# Patient Record
Sex: Male | Born: 1965
Health system: Southern US, Community
[De-identification: ages and names within clinical notes are randomized; demographics above are authoritative.]

## PROBLEM LIST (undated history)

## (undated) DIAGNOSIS — J439 Emphysema, unspecified: Secondary | ICD-10-CM

## (undated) DIAGNOSIS — Z972 Presence of dental prosthetic device (complete) (partial): Secondary | ICD-10-CM

## (undated) DIAGNOSIS — I2510 Atherosclerotic heart disease of native coronary artery without angina pectoris: Secondary | ICD-10-CM

## (undated) DIAGNOSIS — K219 Gastro-esophageal reflux disease without esophagitis: Secondary | ICD-10-CM

## (undated) DIAGNOSIS — I10 Essential (primary) hypertension: Secondary | ICD-10-CM

## (undated) DIAGNOSIS — I700 Atherosclerosis of aorta: Secondary | ICD-10-CM

## (undated) DIAGNOSIS — R519 Headache, unspecified: Secondary | ICD-10-CM

## (undated) HISTORY — DX: Atherosclerosis of aorta: I70.0

## (undated) HISTORY — DX: Essential (primary) hypertension: I10

## (undated) HISTORY — DX: Atherosclerotic heart disease of native coronary artery without angina pectoris: I25.10

## (undated) HISTORY — DX: Emphysema, unspecified: J43.9

---

## 2004-06-02 ENCOUNTER — Emergency Department: Payer: Self-pay | Admitting: Emergency Medicine

## 2004-06-17 ENCOUNTER — Emergency Department: Payer: Self-pay | Admitting: Emergency Medicine

## 2008-01-14 ENCOUNTER — Emergency Department: Payer: Self-pay | Admitting: Emergency Medicine

## 2008-04-04 ENCOUNTER — Emergency Department: Payer: Self-pay | Admitting: Emergency Medicine

## 2009-06-01 ENCOUNTER — Emergency Department: Payer: Self-pay | Admitting: Emergency Medicine

## 2010-11-01 ENCOUNTER — Emergency Department: Payer: Self-pay | Admitting: Internal Medicine

## 2011-04-30 ENCOUNTER — Emergency Department: Payer: Self-pay | Admitting: *Deleted

## 2012-03-14 ENCOUNTER — Emergency Department: Payer: Self-pay | Admitting: Emergency Medicine

## 2020-11-06 ENCOUNTER — Emergency Department
Admission: EM | Admit: 2020-11-06 | Discharge: 2020-11-06 | Disposition: A | Discharge status: Home / Self Care | Payer: BC Managed Care – PPO | Attending: Emergency Medicine | Admitting: Emergency Medicine

## 2020-11-06 ENCOUNTER — Other Ambulatory Visit: Payer: Self-pay

## 2020-11-06 ENCOUNTER — Emergency Department: Payer: BC Managed Care – PPO

## 2020-11-06 ENCOUNTER — Encounter: Payer: Self-pay | Admitting: Emergency Medicine

## 2020-11-06 DIAGNOSIS — I10 Essential (primary) hypertension: Secondary | ICD-10-CM | POA: Diagnosis not present

## 2020-11-06 DIAGNOSIS — R079 Chest pain, unspecified: Secondary | ICD-10-CM

## 2020-11-06 DIAGNOSIS — F17200 Nicotine dependence, unspecified, uncomplicated: Secondary | ICD-10-CM | POA: Insufficient documentation

## 2020-11-06 DIAGNOSIS — R0789 Other chest pain: Secondary | ICD-10-CM | POA: Insufficient documentation

## 2020-11-06 DIAGNOSIS — R0602 Shortness of breath: Secondary | ICD-10-CM | POA: Diagnosis not present

## 2020-11-06 HISTORY — DX: Emphysema, unspecified: J43.9

## 2020-11-06 LAB — BASIC METABOLIC PANEL
Anion gap: 9 (ref 5–15)
BUN: 19 mg/dL (ref 6–20)
CO2: 23 mmol/L (ref 22–32)
Calcium: 8.8 mg/dL — ABNORMAL LOW (ref 8.9–10.3)
Chloride: 106 mmol/L (ref 98–111)
Creatinine, Ser: 0.82 mg/dL (ref 0.61–1.24)
GFR, Estimated: >60 mL/min (ref >60)
Glucose, Bld: 106 mg/dL — ABNORMAL HIGH (ref 70–99)
Potassium: 3.5 mmol/L (ref 3.5–5.1)
Sodium: 138 mmol/L (ref 135–145)

## 2020-11-06 LAB — CBC
HCT: 45.7 % (ref 39.0–52.0)
Hemoglobin: 16.3 g/dL (ref 13.0–17.0)
MCH: 32.4 pg (ref 26.0–34.0)
MCHC: 35.7 g/dL (ref 30.0–36.0)
MCV: 90.9 fL (ref 80.0–100.0)
Platelets: 218 10*3/uL (ref 150–400)
RBC: 5.03 MIL/uL (ref 4.22–5.81)
RDW: 13 % (ref 11.5–15.5)
WBC: 13.2 10*3/uL — ABNORMAL HIGH (ref 4.0–10.5)
nRBC: 0.2 % (ref 0.0–0.2)

## 2020-11-06 LAB — TROPONIN I (HIGH SENSITIVITY)
Troponin I (High Sensitivity): 4 ng/L (ref <18)
Troponin I (High Sensitivity): 4 ng/L (ref <18)

## 2020-11-06 MED ORDER — HYDRALAZINE HCL 20 MG/ML IJ SOLN
5.0000 mg | Freq: Once | INTRAMUSCULAR | Status: AC
  Administered 2020-11-06: 5 mg via INTRAVENOUS
  Filled 2020-11-06: qty 1

## 2020-11-06 MED ORDER — HYDROCHLOROTHIAZIDE 12.5 MG PO TABS: 12.5000 mg | ORAL_TABLET | Freq: Every day | ORAL | 1 refills | Status: DC

## 2020-11-06 NOTE — ED Triage Notes (Signed)
Pt to ED via POV with c/o chest discomfort that started earlier today. Pt describes as a tightness in his chest. Pt denies pain right now states just feels tight.

## 2020-11-06 NOTE — ED Provider Notes (Signed)
[facility] Emergency Department Provider Note   ____________________________________________   I have reviewed the triage vital signs and the nursing notes.   HISTORY  Chief Complaint Chest Pain   History limited by: Not Limited   HPI Justin Olsen is a 55 y.o. male who presents to the emergency department today because of concerns for an episode of chest pain.  Patient states it was located in the center chest and radiated to his right shoulder.  Described as a tightness.  Time my exam has improved.  Denies any significant associated shortness of breath.  Denies any nausea or vomiting.  Patient states that he has had similar spells like this in the past.  However has not seen anyone in the past.  Has not had a primary care doctor because he had not had a job that had insurance for quite some time.  Additionally patient complains of occasional headaches.   Records reviewed. Per medical record review patient has a history of emphysema.   Past Medical History:  Diagnosis Date  . Emphysema lung (HCC)     There are no problems to display for this patient.   History reviewed. No pertinent surgical history.  Prior to Admission medications   Not on File    Allergies Patient has no known allergies.  No family history on file.  Social History Social History   Tobacco Use  . Smoking status: Current Every Day Smoker  . Smokeless tobacco: Never Used  Substance Use Topics  . Alcohol use: Not Currently  . Drug use: Not Currently    Review of Systems Constitutional: No fever/chills Eyes: No visual changes. ENT: No sore throat. Cardiovascular: Positive for chest pain. Respiratory: Denies shortness of breath. Gastrointestinal: No abdominal pain.  No nausea, no vomiting.  No diarrhea.   Genitourinary: Negative for dysuria. Musculoskeletal: Negative for back pain. Skin: Negative for rash. Neurological: Positive for headaches.   ____________________________________________   PHYSICAL EXAM:  VITAL SIGNS: ED Triage Vitals  Enc Vitals Group     BP 11/06/20 1702 (!) 175/122     Pulse Rate 11/06/20 1702 79     Resp 11/06/20 1702 16     Temp 11/06/20 1702 98.1 F (36.7 C)     Temp src --      SpO2 11/06/20 1702 96 %     Weight 11/06/20 1657 150 lb (68 kg)     Height 11/06/20 1657 5\' 8"  (1.727 m)     Head Circumference --      Peak Flow --    Constitutional: Alert and oriented.  Eyes: Conjunctivae are normal.  ENT      Head: Normocephalic and atraumatic.      Nose: No congestion/rhinnorhea.      Mouth/Throat: Mucous membranes are moist.      Neck: No stridor. Hematological/Lymphatic/Immunilogical: No cervical lymphadenopathy. Cardiovascular: Normal rate, regular rhythm.  No murmurs, rubs, or gallops.  Respiratory: Normal respiratory effort without tachypnea nor retractions. Breath sounds are clear and equal bilaterally. No wheezes/rales/rhonchi. Gastrointestinal: Soft and non tender. No rebound. No guarding.  Genitourinary: Deferred Musculoskeletal: Normal range of motion in all extremities. No lower extremity edema. Neurologic:  Normal speech and language. No gross focal neurologic deficits are appreciated.  Skin:  Skin is warm, dry and intact. No rash noted. Psychiatric: Mood and affect are normal. Speech and behavior are normal. Patient exhibits appropriate insight and judgment.  ____________________________________________    LABS (pertinent positives/negatives)  Trop hs 4 x 2 CBC  wbc 13.2, hgb 16.3, plt 218 BMP wnl except glu 106, ca 8.8  ____________________________________________   EKG  I, Phineas Semen, attending physician, personally viewed and interpreted this EKG  EKG Time: 1700 Rate: 85 Rhythm: normal sinus rhythm Axis: normal Intervals: qtc 492 QRS: narrow ST changes: no st elevation Impression: abnormal ekg   ____________________________________________     RADIOLOGY  CXR No active cardiopulmonary disease  ____________________________________________   PROCEDURES  Procedures  ____________________________________________   INITIAL IMPRESSION / ASSESSMENT AND PLAN / ED COURSE  Pertinent labs & imaging results that were available during my care of the patient were reviewed by me and considered in my medical decision making (see chart for details).   Patient presents to the emergency department today because of concern for chest pain. Work up here was reassuring with negative CXR and troponin negative x 2. Patient was found to be hypertensive here. Discussed this with the patient. Do think patient would benefit from hypertension medication. Do think this might be causing some of the patient's symptoms. Will discharge with primary care follow up information and medication prescription.  ____________________________________________   FINAL CLINICAL IMPRESSION(S) / ED DIAGNOSES  Final diagnoses:  Nonspecific chest pain  Hypertension, unspecified type     Note: This dictation was prepared with Dragon dictation. Any transcriptional errors that result from this process are unintentional     Phineas Semen, MD 11/06/20 2258

## 2020-11-06 NOTE — Discharge Instructions (Addendum)
Please seek medical attention for any high fevers, chest pain, shortness of breath, change in behavior, persistent vomiting, bloody stool or any other new or concerning symptoms.  

## 2020-12-07 ENCOUNTER — Ambulatory Visit: Payer: BC Managed Care – PPO | Admitting: Family Medicine

## 2020-12-07 ENCOUNTER — Encounter: Payer: Self-pay | Admitting: Family Medicine

## 2020-12-07 ENCOUNTER — Other Ambulatory Visit: Payer: Self-pay

## 2020-12-07 VITALS — BP 158/112 | HR 78 | Temp 97.2°F | Ht 68.0 in | Wt 160.0 lb

## 2020-12-07 DIAGNOSIS — K219 Gastro-esophageal reflux disease without esophagitis: Secondary | ICD-10-CM | POA: Diagnosis not present

## 2020-12-07 DIAGNOSIS — I10 Essential (primary) hypertension: Secondary | ICD-10-CM

## 2020-12-07 DIAGNOSIS — R519 Headache, unspecified: Secondary | ICD-10-CM | POA: Diagnosis not present

## 2020-12-07 DIAGNOSIS — R0600 Dyspnea, unspecified: Secondary | ICD-10-CM

## 2020-12-07 MED ORDER — PANTOPRAZOLE SODIUM 40 MG PO TBEC: 40.0000 mg | DELAYED_RELEASE_TABLET | Freq: Every day | ORAL | 3 refills | Status: DC

## 2020-12-07 MED ORDER — DILTIAZEM HCL ER COATED BEADS 120 MG PO CP24: 120.0000 mg | ORAL_CAPSULE | Freq: Every day | ORAL | 0 refills | Status: DC

## 2020-12-07 MED ORDER — FLUTICASONE-SALMETEROL 250-50 MCG/ACT IN AEPB: 1.0000 | INHALATION_SPRAY | Freq: Two times a day (BID) | RESPIRATORY_TRACT | 1 refills | Status: DC

## 2020-12-07 NOTE — Progress Notes (Signed)
New patient visit   Patient: Justin Olsen   DOB: Nov 29, 1965   55 y.o. Male  MRN: [id number] Visit Date: 12/07/2020  Today's healthcare provider: [name], MD   No chief complaint on file.  Subjective    Justin Olsen is a 55 y.o. male who presents today as a new patient to establish care.  HPI  Hypertension, follow-up  BP Readings from Last 3 Encounters:  12/07/20 (!) 158/112  11/06/20 (!) 173/117   Wt Readings from Last 3 Encounters:  12/07/20 160 lb (72.6 kg)  11/06/20 150 lb (68 kg)     BP at that visit was 173/117. Management since that visit includes starting HCTZ 12.5mg  daily. He has finished hctz and states BP didn't improve at all when he was taking it.   He reports excellent compliance with treatment. He is not having side effects.  He is following a Regular diet. He is exercising. He does smoke.  Use of agents associated with hypertension: none.   Outside blood pressures are elevated at home. Symptoms: Yes chest pain Yes chest pressure  No palpitations No syncope  Yes dyspnea No orthopnea  No paroxysmal nocturnal dyspnea No lower extremity edema   Pertinent labs: No results found for: CHOL, HDL, LDLCALC, LDLDIRECT, TRIG, CHOLHDL Lab Results  Component Value Date   NA 138 11/06/2020   K 3.5 11/06/2020   CREATININE 0.82 11/06/2020   GFRNONAA >60 11/06/2020   GLUCOSE 106 (H) 11/06/2020     The ASCVD Risk score Denman George DC Jr., et al., 2013) failed to calculate for the following reasons:   Cannot find a previous HDL lab   Cannot find a previous total cholesterol lab    He is scheduled to see Dr. Mariah Milling for chest pain next week. He does state that chest pains seem to  improve when take antiacid medications. Pains tend to occur when he gets up in the morning. He also reports over the last year that he has been having severe right sided headaches that starts around right eye then spreads to temples, periauricular area, right side of face, jaw  and neck. Has a little trouble with vision with headaches. Associated with nausea. Hasn't noticed any weakness or numbness of extremities. Occurs a few times just about every day and improves with ice packs and Goodies. No history of migraines in his younger years.  ---------------------------------------------------------------------------------------------------   Past Medical History:  Diagnosis Date   Emphysema lung (HCC)    History reviewed. No pertinent surgical history. Family Status  Relation Name Status   Mother  Alive   Father  (Not Specified)   MGF  Deceased   PGF  Deceased   Family History  Problem Relation Age of Onset   Stroke Mother    Diabetes Mother    Diabetes Father    Heart disease Father    Cancer Maternal Grandfather    Cancer Paternal Grandfather    Social History   Socioeconomic History   Marital status: Married    Spouse name: Not on file   Number of children: Not on file   Years of education: Not on file   Highest education level: Not on file  Occupational History   Not on file  Tobacco Use   Smoking status: Every Day    Pack years: 0.00   Smokeless tobacco: Never  Vaping Use   Vaping Use: Not on file  Substance and Sexual Activity   Alcohol use: Not Currently   Drug  use: Not Currently   Sexual activity: Not on file  Other Topics Concern   Not on file  Social History Narrative   Not on file   Social Determinants of Health   Financial Resource Strain: Not on file  Food Insecurity: Not on file  Transportation Needs: Not on file  Physical Activity: Not on file  Stress: Not on file  Social Connections: Not on file   Outpatient Medications Prior to Visit  Medication Sig   hydrochlorothiazide (HYDRODIURIL) 12.5 MG tablet Take 1 tablet (12.5 mg total) by mouth daily.   No facility-administered medications prior to visit.   No Known Allergies   There is no immunization history on file for this patient.  Health Maintenance  Topic  Date Due   COVID-19 Vaccine (1) Never done   Pneumococcal Vaccine 11-11 Years old (1 - PCV) Never done   HIV Screening  Never done   Hepatitis C Screening  Never done   TETANUS/TDAP  Never done   COLONOSCOPY (Pts 45-55yrs Insurance coverage will need to be confirmed)  Never done   Zoster Vaccines- Shingrix (1 of 2) Never done   INFLUENZA VACCINE  01/18/2021   HPV VACCINES  Aged Out    Patient Care Team: Malva Limes, MD as PCP - General (Family Medicine)  Review of Systems  Constitutional:  Positive for fatigue. Negative for activity change, appetite change, chills, diaphoresis and fever.  HENT:  Positive for congestion, dental problem, hearing loss, nosebleeds, rhinorrhea and sinus pressure. Negative for drooling, ear discharge, ear pain, facial swelling, mouth sores, postnasal drip, sinus pain, sneezing, sore throat, tinnitus, trouble swallowing and voice change.   Eyes:  Positive for photophobia. Negative for pain, discharge, redness, itching and visual disturbance.  Respiratory:  Positive for cough, chest tightness, shortness of breath and wheezing. Negative for apnea, choking and stridor.   Cardiovascular:  Positive for chest pain. Negative for palpitations and leg swelling.  Gastrointestinal: Negative.   Endocrine: Negative.   Genitourinary: Negative.   Musculoskeletal:  Positive for arthralgias, back pain, joint swelling, myalgias, neck pain and neck stiffness. Negative for gait problem.  Skin: Negative.   Allergic/Immunologic: Negative.   Neurological:  Positive for light-headedness and headaches. Negative for dizziness, tremors, seizures, syncope, facial asymmetry, speech difficulty, weakness and numbness.  Hematological:  Negative for adenopathy. Bruises/bleeds easily.  Psychiatric/Behavioral: Negative.       Objective    BP (!) 158/112 (BP Location: Right Arm, Patient Position: Sitting, Cuff Size: Normal)   Pulse 78   Temp (!) 97.2 F (36.2 C) (Temporal)   Ht 5\' 8"   (1.727 m)   Wt 160 lb (72.6 kg)   BMI 24.33 kg/m  Physical Exam   General: Appearance:    Well developed, well nourished male in no acute distress  Eyes:    PERRL, conjunctiva/corneas clear, EOM's intact       Lungs:     Clear to auscultation bilaterally, respirations unlabored  Heart:    Normal heart rate. Normal rhythm. No murmurs, rubs, or gallops.   MS:   All extremities are intact.   Neurologic:   Awake, alert, oriented x 3. No apparent focal neurological           defect.        Depression Screen PHQ 2/9 Scores 12/07/2020  PHQ - 2 Score 0  PHQ- 9 Score 0     Assessment & Plan      1. Gastroesophageal reflux disease, unspecified whether esophagitis present Has  taking his wife Protonix on occasion which seems to help with reflux symptoms. start- pantoprazole (PROTONIX) 40 MG tablet; Take 1 tablet (40 mg total) by mouth daily.  Dispense: 30 tablet; Refill: 3  2. Dyspnea, unspecified type Long term smoker. Likely an element of COPD. start- fluticasone-salmeterol (ADVAIR DISKUS) 250-50 MCG/ACT AEPB; Inhale 1 puff into the lungs in the morning and at bedtime.  Dispense: 1 each; Refill: 1  3. Primary hypertension No improvement on hctz. Consider severe headaches will start diltiazem (CARDIZEM CD) 120 MG 24 hr capsule; Take 1 capsule (120 mg total) by mouth daily.  Dispense: 30 capsule; Refill: 0  Will likely require more than one agent. He does have appt to establish with Dr. Mariah Milling next week and will see how BP is doing then.   4. Nonintractable episodic headache, unspecified headache type No sign of neurological deficits, but also no known history of migraine headaches in his younger years. Start CCB as above for hypertension. Anticipate neuroimaging unless there is significant improvement in headaches within a few days.      The entirety of the information documented in the History of Present Illness, Review of Systems and Physical Exam were personally obtained by me.  Portions of this information were initially documented by the CMA and reviewed by me for thoroughness and accuracy.     [name], MD  [facility] [phone] (phone) [phone] (fax)  [facility]

## 2020-12-13 DIAGNOSIS — R079 Chest pain, unspecified: Secondary | ICD-10-CM | POA: Insufficient documentation

## 2020-12-13 DIAGNOSIS — I10 Essential (primary) hypertension: Secondary | ICD-10-CM | POA: Insufficient documentation

## 2020-12-13 DIAGNOSIS — F17200 Nicotine dependence, unspecified, uncomplicated: Secondary | ICD-10-CM | POA: Insufficient documentation

## 2020-12-13 DIAGNOSIS — K219 Gastro-esophageal reflux disease without esophagitis: Secondary | ICD-10-CM | POA: Insufficient documentation

## 2020-12-13 DIAGNOSIS — J449 Chronic obstructive pulmonary disease, unspecified: Secondary | ICD-10-CM | POA: Insufficient documentation

## 2020-12-13 NOTE — Progress Notes (Signed)
Cardiology Office Note  Date:  12/14/2020   ID:  [name], DOB [birth year], MRN [id number]  PCP:  Malva Limes, MD   Chief Complaint  Patient presents with   New Patient (Initial Visit)    Ref by Dr. Sherrie Mustache for elevated blood pressure & chest pain. Patient c/o occasional LE edema, fluttering in chest at times, shortness of breath and chest pain that radiates down arms; symptoms off & on for one year. Medications reviewed by the patient verbally.     HPI:  Justin Olsen is a 55 y.o. male who presents to the  Copd Smoker HTN emergency department 11/06/20 for chest pain.   located in the center chest and radiated to his right shoulder.  tightness.   similar spells like this in the past.  Referred by dr. 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 Patient was found to be hypertensive  Wrist cuff, systolic 160- 200/90s -100 Checking pressure when he does not feel well, does not check pressure at baseline  EKG personally reviewed by myself on todays visit NSR rate 80 bpm no significant ST-T wave changes   PMH:   has a past medical history of Emphysema lung (HCC). Smoker Essential hypertension  PSH:   No past surgical history on file.  Current Outpatient Medications  Medication Sig Dispense Refill   Aspirin-Salicylamide-Caffeine (BC HEADACHE POWDER PO) Take by mouth 2 (two) times daily as needed.     diltiazem (CARDIZEM CD) 120 MG 24 hr capsule Take 1 capsule (120 mg total) by mouth daily. 30 capsule 0   fluticasone-salmeterol (ADVAIR DISKUS)  250-50 MCG/ACT AEPB Inhale 1 puff into the lungs in the morning and at bedtime. 1 each 1   pantoprazole (PROTONIX) 40 MG tablet Take 1 tablet (40 mg total) by mouth daily. 30 tablet 3   No current facility-administered medications for this visit.     Allergies:   Patient has no known allergies.   Social History:  The patient  reports that he has been smoking cigarettes. He has a 30.00 pack-year smoking history. He has never used smokeless tobacco. He reports current alcohol use of about 4.0 standard drinks of alcohol per week. He reports previous drug use.   Family History:   family history includes Cancer in his maternal grandfather and paternal grandfather; Diabetes in his father and mother; Heart disease in his father; Stroke in his mother.    Review of Systems: Review of Systems  Constitutional: Negative.   HENT: Negative.    Respiratory: Negative.    Cardiovascular: Negative.   Gastrointestinal: Negative.   Musculoskeletal: Negative.   Neurological:  Positive for headaches.  Psychiatric/Behavioral: Negative.    All other systems reviewed and are negative.   PHYSICAL EXAM: VS:  BP (!) 140/98 (BP Location: Right Arm, Patient Position: Sitting, Cuff Size: Normal)   Pulse 80   Ht 5\' 8"  (1.727 m)   Wt 155 lb (70.3 kg)   SpO2 98%   BMI 23.57 kg/m  , BMI Body mass index is 23.57 kg/m. GEN: Well nourished,  well developed, in no acute distress HEENT: normal Neck: no JVD, carotid bruits, or masses Cardiac: RRR; no murmurs, rubs, or gallops,no edema  Respiratory:  clear to auscultation bilaterally, normal work of breathing GI: soft, nontender, nondistended, + BS MS: no deformity or atrophy Skin: warm and dry, no rash Neuro:  Strength and sensation are intact Psych: euthymic mood, full affect  Recent Labs: 11/06/2020: BUN 19; Creatinine, Ser 0.82; Hemoglobin 16.3; Platelets 218; Potassium 3.5; Sodium 138    Lipid Panel No results found for: CHOL, HDL, LDLCALC, TRIG     Wt Readings from Last 3 Encounters:  12/14/20 155 lb (70.3 kg)  12/07/20 160 lb (72.6 kg)  11/06/20 150 lb (68 kg)       ASSESSMENT AND PLAN:  Problem List Items Addressed This Visit       Cardiology Problems   Benign essential HTN   Relevant Orders   EKG 12-Lead     Other   Smoker   Chest pain of uncertain etiology   Relevant Orders   EKG 12-Lead   COPD (chronic obstructive pulmonary disease) (HCC)   GERD (gastroesophageal reflux disease)   Headache Etiology unclear Works in the heat Unable to exclude neck issues, has worked in [employer] representative for 30 years Taking lots of BC powder for his headaches which seems to alleviate the symptoms Consider neurology if symptoms persist Reports he is staying hydrated  Smoker We have encouraged him to continue to work on weaning his cigarettes and smoking cessation. He will continue to work on this and does not want any assistance with chantix.    HTN Stay on diltiazem ER 120  Monitor at home We did discuss higher dose diltiazem but he reports having dizziness/orthostasis symptoms at work when on HCTZ He will monitor blood pressure at home and call us with numbers  Family hx Check lipids today CT coronary calcium score   Total encounter time more than 60 minutes  Greater than 50% was spent in counseling and coordination of care with the patient    Signed, Dossie Arbour, M.D., Ph.D. [facility] [place], [place] [phone]

## 2020-12-14 ENCOUNTER — Other Ambulatory Visit
Admission: RE | Admit: 2020-12-14 | Discharge: 2020-12-14 | Disposition: A | Discharge status: Home / Self Care | Payer: BC Managed Care – PPO | Attending: Cardiovascular Disease | Admitting: Cardiovascular Disease

## 2020-12-14 ENCOUNTER — Other Ambulatory Visit: Payer: Self-pay

## 2020-12-14 ENCOUNTER — Encounter: Payer: Self-pay | Admitting: Cardiovascular Disease

## 2020-12-14 ENCOUNTER — Ambulatory Visit: Payer: BC Managed Care – PPO | Admitting: Cardiovascular Disease

## 2020-12-14 VITALS — BP 140/98 | HR 80 | Ht 68.0 in | Wt 155.0 lb

## 2020-12-14 DIAGNOSIS — K219 Gastro-esophageal reflux disease without esophagitis: Secondary | ICD-10-CM

## 2020-12-14 DIAGNOSIS — Z8342 Family history of familial hypercholesterolemia: Secondary | ICD-10-CM | POA: Insufficient documentation

## 2020-12-14 DIAGNOSIS — R079 Chest pain, unspecified: Secondary | ICD-10-CM | POA: Diagnosis not present

## 2020-12-14 DIAGNOSIS — I10 Essential (primary) hypertension: Secondary | ICD-10-CM | POA: Diagnosis not present

## 2020-12-14 DIAGNOSIS — Z13220 Encounter for screening for lipoid disorders: Secondary | ICD-10-CM | POA: Insufficient documentation

## 2020-12-14 DIAGNOSIS — J432 Centrilobular emphysema: Secondary | ICD-10-CM

## 2020-12-14 DIAGNOSIS — F17200 Nicotine dependence, unspecified, uncomplicated: Secondary | ICD-10-CM

## 2020-12-14 LAB — LIPID PANEL
Cholesterol: 220 mg/dL — ABNORMAL HIGH (ref 0–200)
HDL: 42 mg/dL (ref >40)
LDL Cholesterol: 161 mg/dL — ABNORMAL HIGH (ref 0–99)
Total CHOL/HDL Ratio: 5.2 RATIO
Triglycerides: 86 mg/dL (ref <150)
VLDL: 17 mg/dL (ref 0–40)

## 2020-12-14 NOTE — Patient Instructions (Addendum)
Medication Instructions:  Stay on diltiazem 120 for now  If you need a refill on your cardiac medications before your next appointment, please call your pharmacy.   Lab work: Lipid Walk into medical mall at the check in desk, they will direct you to lab registration, hours for labs are Monday-Friday 07:00am-5:30pm (no appointment necessary)  Testing/Procedures: We have order a CT coronary calcium score (you may see results on your MyChart account, but we will call via phone with the results)  This is a $99 out of pocket expense   This procedure uses special x-ray equipment to produce pictures of the coronary arteries to determine if they are blocked or narrowed by the buildup of plaque - an indicator for atherosclerosis or coronary artery disease (CAD).  Please call [phone] to schedule at your earliest convince   This is done at our [facility] in [facility] [address] [place], [place] [address]   Follow-Up: At [facility], you and your health needs are our priority.  As part of our continuing mission to provide you with exceptional heart care, we have created designated Provider Care Teams.  These Care Teams include your primary Cardiologist (physician) and Advanced Practice Providers (APPs -  Physician Assistants and Nurse Practitioners) who all work together to provide you with the care you need, when you need it.  You will need a follow up appointment as needed  Providers on your designated Care Team:   Justin Ducking, NP Justin Listen, PA-C Justin Ivan, PA-C Justin Olsen, [place]  COVID-19 Vaccine Information can be found at: [email or web address] For questions related to vaccine distribution or appointments, please email [email or web address] or call [phone].   Please monitor blood pressures and keep a log of your readings.  Call the clinic in 2  weeks with BP readings, or upload them to your MyChart account  How to use a home blood pressure monitor. Be still. Measure at the same time every day. It's important to take the readings at the same time each day, such as morning and evening. Take reading approximately 1 1/2 to 2 hours after BP medications.   AVOID these things for 30 minutes before checking your blood pressure: Drinking caffeine. Drinking alcohol. Eating. Smoking. Exercising.   Five minutes before checking your blood pressure: Pee. Sit in a dining chair. Avoid sitting in a soft couch or armchair. Be quiet. Do not talk.       Sit correctly. Sit with your back straight and supported (on a dining chair, rather than a sofa). Your feet should be flat on the floor and your legs should not be crossed. Your arm should be supported on a flat surface (such as a table) with the upper arm at heart level. Make sure the bottom of the cuff is placed directly above the bend of the elbow.

## 2020-12-16 ENCOUNTER — Telehealth: Payer: Self-pay | Admitting: Family Medicine

## 2020-12-16 DIAGNOSIS — G8929 Other chronic pain: Secondary | ICD-10-CM

## 2020-12-16 NOTE — Telephone Encounter (Signed)
Tried calling patient. Left message to call back. OK for PEC triage to speak with patient.  

## 2020-12-16 NOTE — Telephone Encounter (Signed)
Patient called, left VM to return the call to the office for a message from the provider. 

## 2020-12-16 NOTE — Telephone Encounter (Signed)
Patient was seen last week with severe headaches thought to be due to very high blood pressure. Please check with patient to see if he is doing OK with new BP medication and if headaches have improved. If not he may need CT.

## 2020-12-18 NOTE — Telephone Encounter (Signed)
LMTCB 12/18/2020.  PEC please advised pt when he calls back.   Thanks,   -Vernona Rieger

## 2020-12-18 NOTE — Telephone Encounter (Signed)
Patient returned call. Patient states that headaches have improved and they are not as severe. Patient states he has been taking the BP medication daily and feels that it is helping but he still headaches at times. Patient reports that headaches were not bad on Monday, Tuesday and Wednesday. Patient states on Thursday he woke up around 1:30 am with a headache that persisted until around 9 am. Patient states he had a visit with cardiologist on Monday and BP was 140/98. Patient states BO yesterday evening was 160/101 with a wrist monitor. BP not taken today. Patient ok with having MRI ordered if needed.

## 2020-12-18 NOTE — Addendum Note (Signed)
Addended by: Malva Limes on: 12/18/2020 08:27 AM   Modules accepted: Orders

## 2020-12-18 NOTE — Telephone Encounter (Signed)
MRI order has been placed. Please schedule appointment.

## 2020-12-18 NOTE — Telephone Encounter (Signed)
Got a MyChart message from his mother stating headaches aren't any better. Needs to go ahead and order MRI brain for evaluation of headaches.

## 2020-12-22 ENCOUNTER — Telehealth: Payer: Self-pay

## 2020-12-22 NOTE — Telephone Encounter (Signed)
MyChart sent as reminder that his CT coronary calcium test was order at his last visit  Pt will need to call to schedule, based on results Dr. Mariah Milling can advise on medication for elevated LDL

## 2021-01-04 ENCOUNTER — Ambulatory Visit: Payer: BC Managed Care – PPO | Admitting: Family Medicine

## 2021-01-04 ENCOUNTER — Ambulatory Visit
Admission: RE | Admit: 2021-01-04 | Discharge: 2021-01-04 | Disposition: A | Discharge status: Home / Self Care | Payer: BC Managed Care – PPO | Source: Ambulatory Visit | Attending: Cardiovascular Disease | Admitting: Cardiovascular Disease

## 2021-01-04 ENCOUNTER — Other Ambulatory Visit: Payer: Self-pay

## 2021-01-04 ENCOUNTER — Encounter: Payer: Self-pay | Admitting: Family Medicine

## 2021-01-04 VITALS — BP 179/115 | HR 67 | Resp 16 | Ht 68.0 in | Wt 164.0 lb

## 2021-01-04 DIAGNOSIS — E785 Hyperlipidemia, unspecified: Secondary | ICD-10-CM

## 2021-01-04 DIAGNOSIS — I10 Essential (primary) hypertension: Secondary | ICD-10-CM

## 2021-01-04 DIAGNOSIS — R519 Headache, unspecified: Secondary | ICD-10-CM | POA: Diagnosis not present

## 2021-01-04 DIAGNOSIS — F17200 Nicotine dependence, unspecified, uncomplicated: Secondary | ICD-10-CM | POA: Insufficient documentation

## 2021-01-04 DIAGNOSIS — Z8342 Family history of familial hypercholesterolemia: Secondary | ICD-10-CM | POA: Insufficient documentation

## 2021-01-04 DIAGNOSIS — K219 Gastro-esophageal reflux disease without esophagitis: Secondary | ICD-10-CM

## 2021-01-04 DIAGNOSIS — G2581 Restless legs syndrome: Secondary | ICD-10-CM

## 2021-01-04 DIAGNOSIS — R0600 Dyspnea, unspecified: Secondary | ICD-10-CM

## 2021-01-04 MED ORDER — CLONAZEPAM 0.5 MG PO TABS
0.5000 mg | ORAL_TABLET | Freq: Every day | ORAL | 1 refills | Status: DC
Start: 2021-01-04 — End: 2021-03-19

## 2021-01-04 MED ORDER — PANTOPRAZOLE SODIUM 40 MG PO TBEC: 40.0000 mg | DELAYED_RELEASE_TABLET | Freq: Every day | ORAL | 3 refills | Status: DC

## 2021-01-04 MED ORDER — DILTIAZEM HCL ER COATED BEADS 180 MG PO CP24: 180.0000 mg | ORAL_CAPSULE | Freq: Every day | ORAL | 1 refills | Status: DC

## 2021-01-04 NOTE — Progress Notes (Signed)
Established patient visit   Patient: Justin Olsen   DOB: 1965-10-04   55 y.o. Male  MRN: [id number] Visit Date: 01/04/2021  Today's healthcare provider: [name], MD   Chief Complaint  Patient presents with   Hypertension   Subjective    HPI  Hypertension, follow-up  BP Readings from Last 3 Encounters:  01/04/21 (!) 179/115  12/14/20 (!) 140/98  12/07/20 (!) 158/112   Wt Readings from Last 3 Encounters:  01/04/21 164 lb (74.4 kg)  12/14/20 155 lb (70.3 kg)  12/07/20 160 lb (72.6 kg)     He was last seen for hypertension 1 months ago.  BP at that visit was 158/112. Management since that visit includes starting diltiazem 120mg .  He reports good compliance with treatment. He is not having side effects.  He is following a Regular diet. He is not exercising. He does smoke.  Use of agents associated with hypertension: none.   Outside blood pressures are checked occasionally. Symptoms: No chest pain No chest pressure  No palpitations No syncope  No dyspnea No orthopnea  No paroxysmal nocturnal dyspnea No lower extremity edema   Pertinent labs: Lab Results  Component Value Date   CHOL 220 (H) 12/14/2020   HDL 42 12/14/2020   LDLCALC 161 (H) 12/14/2020   TRIG 86 12/14/2020   CHOLHDL 5.2 12/14/2020   Lab Results  Component Value Date   NA 138 11/06/2020   K 3.5 11/06/2020   CREATININE 0.82 11/06/2020   GFRNONAA >60 11/06/2020   GLUCOSE 106 (H) 11/06/2020     The 10-year ASCVD risk score 11/08/2020 DC Jr., et al., 2013) is: 24.2%   GERD, Follow up:  The patient was last seen for GERD 1 months ago. Changes made since that visit include starting on Protonix 40mg  daily.  He reports good compliance with treatment. He is not having side effects. .  Follow up for migraine headaches  The patient was last seen for this 1 months ago. Changes made at last visit include starting CCB (diltiazem).  He reports good compliance with treatment. He feels  that condition is Improved.  Was having sever headaches every day, no having 3-4 a week and not as severe.  He is not having side effects.   He does state his legs are restless at night making it difficulty to fall asleep and his wife states his sleep is also very restless.     Medications: Outpatient Medications Prior to Visit  Medication Sig   Aspirin-Salicylamide-Caffeine (BC HEADACHE POWDER PO) Take by mouth 2 (two) times daily as needed.   diltiazem (CARDIZEM CD) 120 MG 24 hr capsule Take 1 capsule (120 mg total) by mouth daily.   fluticasone-salmeterol (ADVAIR DISKUS) 250-50 MCG/ACT AEPB Inhale 1 puff into the lungs in the morning and at bedtime.   pantoprazole (PROTONIX) 40 MG tablet Take 1 tablet (40 mg total) by mouth daily.   No facility-administered medications prior to visit.    Review of Systems  Constitutional:  Negative for activity change and fatigue.  Respiratory:  Negative for cough and shortness of breath.   Cardiovascular:  Negative for chest pain, palpitations and leg swelling.  Musculoskeletal:  Negative for arthralgias.  Neurological:  Positive for headaches. Negative for dizziness and light-headedness.      Objective    BP (!) 179/115   Pulse 67   Resp 16   Ht 5\' 8"  (1.727 m)   Wt 164 lb (74.4 kg)   BMI  24.94 kg/m     Physical Exam  General appearance: Well developed, well nourished male, cooperative and in no acute distress Head: Normocephalic, without obvious abnormality, atraumatic Respiratory: Respirations even and unlabored, normal respiratory rate Extremities: All extremities are intact.  Skin: Skin color, texture, turgor normal. No rashes seen  Psych: Appropriate mood and affect. Neurologic: Mental status: Alert, oriented to person, place, and time, thought content appropriate.    Assessment & Plan     1. Primary hypertension Is tolerating initiation of diltiazem. BP not to goal, increase diltiazem (CARDIZEM CD) to 180 MG 24 hr capsule;  Take 1 capsule (180 mg total) by mouth daily.  Dispense: 30 capsule; Refill: 1  Discussed adding a second medication if not at goal at follow up in a month.    2. Nonintractable episodic headache, unspecified headache type High risk for cerebrovascular disease due to years of very high blood pressure. MRI has already been ordered - diltiazem (CARDIZEM CD) 180 MG 24 hr capsule; Take 1 capsule (180 mg total) by mouth daily.  Dispense: 30 capsule; Refill: 1  3. Gastroesophageal reflux disease, unspecified whether esophagitis present Much better since starting - pantoprazole (PROTONIX) 40 MG tablet; Take 1 tablet (40 mg total) by mouth daily.  Dispense: 90 tablet; Refill: 3  4. Dyspnea, unspecified type Much better since starting on Advair, although he is only taking once a day since it keeps him up at night when he takes it in the evening.   5. RLS (restless legs syndrome) No sign of abnormal breathing when sleeping per his wife, will try clonazePAM (KLONOPIN) 0.5 MG tablet; Take 1 tablet (0.5 mg total) by mouth at bedtime.  Dispense: 30 tablet; Refill: 1  6. Hyperlipidemia, unspecified hyperlipidemia type Is scheduled for coronary artery CT scan today. Consider adding statin if abnormal result     Future Appointments  Date Time Provider Department Center  01/04/2021  4:00 PM [facility]  02/19/2021  8:40 AM Sherrie Mustache, [name], MD [facility]        The entirety of the information documented in the History of Present Illness, Review of Systems and Physical Exam were personally obtained by me. Portions of this information were initially documented by the CMA and reviewed by me for thoroughness and accuracy.     [name], MD  [facility] [phone] (phone) [phone] (fax)  [facility]

## 2021-01-12 ENCOUNTER — Telehealth: Payer: Self-pay | Admitting: Cardiovascular Disease

## 2021-01-12 DIAGNOSIS — E7800 Pure hypercholesterolemia, unspecified: Secondary | ICD-10-CM

## 2021-01-12 NOTE — Telephone Encounter (Signed)
Justin Iba, MD  01/10/2021 11:51 AM EDT      Good news, low calcium score 23 Would recommend smoking cessation

## 2021-01-12 NOTE — Telephone Encounter (Signed)
Attempted to call the patient. No answer- I left a message to please call back.  

## 2021-01-14 NOTE — Telephone Encounter (Signed)
Late entry: the patient called back to the office on 01/12/21. I have reviewed the results of his Calcium Score with him.  We discussed Dr. Windell Hummingbird recommendations to stop smoking to help decrease his chances of obstructive coronary disease in the future.  We also discussed his lipid panel again- LDL 161 (12/14/20). He advised with his family history, he is not opposed to a low dose statin for prevention.  I have advised the patient I will forward to Dr. Mariah Milling to review/ for further recommendations. The patient would also like to have an annual follow up with Dr. Mariah Milling as well.   He is aware we will call him back with further recommendations.  The patient voices understanding and is agreeable.  He said he where he works is very loud and he may not answer the call. Ok to leave detailed message and send a statin RX to his pharmacy.

## 2021-01-18 MED ORDER — ROSUVASTATIN CALCIUM 20 MG PO TABS
20.0000 mg | ORAL_TABLET | Freq: Every day | ORAL | 3 refills | Status: DC
Start: 2021-01-18 — End: 2022-01-27

## 2021-01-18 NOTE — Telephone Encounter (Signed)
Was able to f/u with Justin Olsen vis phone, advised on Dr. Windell Hummingbird recommendation regarding his elevated LDL levels  "Would recommend Crestor 10 mg daily  Lipid panel 3 months"  Pt agrees to medication, sent script to [facility] as requested, advised pt to repeat Lipid in 3-4 month time, Walk into medical mall at the check in desk, they will direct you to lab registration, hours for labs are Monday-Friday 07:00am-5:30pm (no appointment necessary).  Pt verbalized understanding and thankful for return phone call.

## 2021-01-20 ENCOUNTER — Telehealth: Payer: Self-pay

## 2021-01-20 NOTE — Telephone Encounter (Signed)
Copied from CRM [id number]. Topic: General - Other >> Jan 20, 2021  9:52 AM Marylen Ponto wrote: Reason for CRM: Pt stated he has not heard from anyone in regards to the MRI. Pt requests call back

## 2021-01-22 NOTE — Telephone Encounter (Signed)
Please advise 

## 2021-02-08 ENCOUNTER — Other Ambulatory Visit: Payer: Self-pay

## 2021-02-08 ENCOUNTER — Ambulatory Visit
Admission: RE | Admit: 2021-02-08 | Discharge: 2021-02-08 | Disposition: A | Discharge status: Home / Self Care | Payer: BC Managed Care – PPO | Source: Ambulatory Visit | Attending: Family Medicine | Admitting: Family Medicine

## 2021-02-08 DIAGNOSIS — R519 Headache, unspecified: Secondary | ICD-10-CM | POA: Diagnosis not present

## 2021-02-08 DIAGNOSIS — G8929 Other chronic pain: Secondary | ICD-10-CM | POA: Diagnosis not present

## 2021-02-08 MED ORDER — GADOBUTROL 1 MMOL/ML IV SOLN
7.5000 mL | Freq: Once | INTRAVENOUS | Status: AC | PRN
  Administered 2021-02-08: 7.5 mL via INTRAVENOUS

## 2021-02-10 ENCOUNTER — Encounter: Payer: Self-pay | Admitting: Family Medicine

## 2021-02-10 DIAGNOSIS — Z8673 Personal history of transient ischemic attack (TIA), and cerebral infarction without residual deficits: Secondary | ICD-10-CM | POA: Insufficient documentation

## 2021-02-19 ENCOUNTER — Ambulatory Visit: Payer: BC Managed Care – PPO | Admitting: Family Medicine

## 2021-02-19 ENCOUNTER — Encounter: Payer: Self-pay | Admitting: Family Medicine

## 2021-02-19 ENCOUNTER — Other Ambulatory Visit: Payer: Self-pay

## 2021-02-19 VITALS — BP 157/102 | HR 75 | Temp 98.4°F | Resp 16 | Wt 164.6 lb

## 2021-02-19 DIAGNOSIS — Z23 Encounter for immunization: Secondary | ICD-10-CM | POA: Diagnosis not present

## 2021-02-19 DIAGNOSIS — R0600 Dyspnea, unspecified: Secondary | ICD-10-CM

## 2021-02-19 DIAGNOSIS — I10 Essential (primary) hypertension: Secondary | ICD-10-CM

## 2021-02-19 DIAGNOSIS — G2581 Restless legs syndrome: Secondary | ICD-10-CM | POA: Diagnosis not present

## 2021-02-19 DIAGNOSIS — R519 Headache, unspecified: Secondary | ICD-10-CM

## 2021-02-19 MED ORDER — FLUTICASONE-SALMETEROL 250-50 MCG/ACT IN AEPB: 1.0000 | INHALATION_SPRAY | Freq: Every day | RESPIRATORY_TRACT | 3 refills | Status: DC

## 2021-02-19 MED ORDER — DILTIAZEM HCL ER COATED BEADS 240 MG PO CP24: 240.0000 mg | ORAL_CAPSULE | Freq: Every day | ORAL | 1 refills | Status: DC

## 2021-02-19 MED ORDER — GABAPENTIN 300 MG PO CAPS
300.0000 mg | ORAL_CAPSULE | Freq: Every evening | ORAL | 1 refills | Status: DC
Start: 2021-02-19 — End: 2021-03-19

## 2021-02-19 MED ORDER — ALBUTEROL SULFATE HFA 108 (90 BASE) MCG/ACT IN AERS: 2.0000 | INHALATION_SPRAY | Freq: Four times a day (QID) | RESPIRATORY_TRACT | 2 refills | Status: DC | PRN

## 2021-02-19 NOTE — Progress Notes (Signed)
Established patient visit   Patient: Justin Olsen   DOB: 13-Jul-1965   55 y.o. Male  MRN: [id number] Visit Date: 02/19/2021  Today's healthcare provider: [name], MD   Chief Complaint  Patient presents with   Hypertension   Restless legs Syndrome   Subjective  -------------------------------------------------------------------------------------------------------------------- HPI  Hypertension, follow-up  BP Readings from Last 3 Encounters:  02/19/21 (!) 157/102  01/04/21 (!) 179/115  12/14/20 (!) 140/98   Wt Readings from Last 3 Encounters:  02/19/21 164 lb 9.6 oz (74.7 kg)  01/04/21 164 lb (74.4 kg)  12/14/20 155 lb (70.3 kg)     He was last seen for hypertension on 01/04/2021.    BP at that visit was 179/115. Management since that visit includes increasing Diltiazem to 180mg  daily.  He reports good compliance with treatment. He is not having side effects.  He is following a Regular diet. He is exercising. He does smoke.  Use of agents associated with hypertension: none.   Outside blood pressures are 160-175/ 95. Symptoms: No chest pain No chest pressure  No palpitations No syncope  Yes dyspnea No orthopnea  No paroxysmal nocturnal dyspnea No lower extremity edema   Pertinent labs: Lab Results  Component Value Date   CHOL 220 (H) 12/14/2020   HDL 42 12/14/2020   LDLCALC 161 (H) 12/14/2020   TRIG 86 12/14/2020   CHOLHDL 5.2 12/14/2020   Lab Results  Component Value Date   NA 138 11/06/2020   K 3.5 11/06/2020   CREATININE 0.82 11/06/2020   GFRNONAA >60 11/06/2020   GLUCOSE 106 (H) 11/06/2020     The 10-year ASCVD risk score 11/08/2020 DC Jr., et al., 2013) is: 19.7%   ---------------------------------------------------------------------------------------------------   Follow up for restless legs syndrome:  The patient was last seen for this 1 months ago. Changes made at last visit include starting clonazepam 0.5mg  at bedtime.  He  reports good compliance with treatment. He feels that condition is Improved. He is not having side effects.   -----------------------------------------------------------------------------------------    Medications: Outpatient Medications Prior to Visit  Medication Sig   Aspirin-Salicylamide-Caffeine (BC HEADACHE POWDER PO) Take by mouth 2 (two) times daily as needed.   clonazePAM (KLONOPIN) 0.5 MG tablet Take 1 tablet (0.5 mg total) by mouth at bedtime.   diltiazem (CARDIZEM CD) 180 MG 24 hr capsule Take 1 capsule (180 mg total) by mouth daily.   pantoprazole (PROTONIX) 40 MG tablet Take 1 tablet (40 mg total) by mouth daily.   rosuvastatin (CRESTOR) 20 MG tablet Take 1 tablet (20 mg total) by mouth daily.   fluticasone-salmeterol (ADVAIR DISKUS) 250-50 MCG/ACT AEPB Inhale 1 puff into the lungs in the morning and at bedtime. (Patient not taking: Reported on 02/19/2021)   No facility-administered medications prior to visit.    Review of Systems  Constitutional:  Negative for appetite change, chills and fever.  Respiratory:  Positive for shortness of breath. Negative for chest tightness and wheezing.   Cardiovascular:  Negative for chest pain and palpitations.  Gastrointestinal:  Negative for abdominal pain, nausea and vomiting.     Objective  -------------------------------------------------------------------------------------------------------------------- BP (!) 157/102 (BP Location: Left Arm, Patient Position: Sitting, Cuff Size: Normal)   Pulse 75   Temp 98.4 F (36.9 C) (Temporal)   Resp 16   Wt 164 lb 9.6 oz (74.7 kg)   BMI 25.03 kg/m   Physical Exam    General: Appearance:     Well developed, well nourished male in no  acute distress  Eyes:    PERRL, conjunctiva/corneas clear, EOM's intact       Lungs:     Clear to auscultation bilaterally, respirations unlabored  Heart:    Normal heart rate. Normal rhythm. No murmurs, rubs, or gallops.    MS:   All extremities are  intact.    Neurologic:   Awake, alert, oriented x 3. No apparent focal neurological defect.          Assessment & Plan  ---------------------------------------------------------------------------------------------------------------------- 1. Primary hypertension Improved, but not to goal. Increase from 180 to  diltiazem (CARDIZEM CD) 240 MG 24 hr capsule; Take 1 capsule (240 mg total) by mouth daily.  Dispense: 30 capsule; Refill: 1  2. Nonintractable episodic headache, unspecified headache type Improved with since starting BP medications, but still having occasional severe head. No pathological findings on MRI   Start gabapentin (NEURONTIN) 300 MG capsule; Take 1 capsule (300 mg total) by mouth every evening.  Dispense: 30 capsule; Refill: 1  Increase to diltiazem (CARDIZEM CD) 240 MG 24 hr capsule; Take 1 capsule (240 mg total) by mouth daily.  Dispense: 30 capsule; Refill: 1  3. Dyspnea, unspecified type He ran out of advair, but was keeping him up at night. rx- albuterol (VENTOLIN HFA) 108 (90 Base) MCG/ACT inhaler; Inhale 2 puffs into the lungs every 6 (six) hours as needed for wheezing or shortness of breath.  Dispense: 18 g; Refill: 2  Can just take Advair in the morning. - fluticasone-salmeterol (ADVAIR DISKUS) 250-50 MCG/ACT AEPB; Inhale 1 puff into the lungs daily.  Dispense: 1 each; Refill: 3  4. Restless leg syndrome Try - gabapentin (NEURONTIN) 300 MG capsule; Take 1 capsule (300 mg total) by mouth every evening.  Dispense: 30 capsule; Refill: 1  5. Need for immunization against influenza  - Flu Vaccine QUAD 75mo+IM (Fluarix, Fluzone & Alfiuria Quad PF)   Future Appointments  Date Time Provider Department Center  03/19/2021  2:20 PM Sherrie Mustache [name], MD [facility]         The entirety of the information documented in the History of Present Illness, Review of Systems and Physical Exam were personally obtained by me. Portions of this information were initially documented  by the CMA and reviewed by me for thoroughness and accuracy.     [name], MD  [facility] [phone] (phone) [phone] (fax)  [facility]

## 2021-03-19 ENCOUNTER — Other Ambulatory Visit: Payer: Self-pay

## 2021-03-19 ENCOUNTER — Encounter: Payer: Self-pay | Admitting: Family Medicine

## 2021-03-19 ENCOUNTER — Ambulatory Visit: Payer: BC Managed Care – PPO | Admitting: Family Medicine

## 2021-03-19 VITALS — BP 153/99 | HR 71 | Temp 98.5°F | Resp 18 | Wt 165.9 lb

## 2021-03-19 DIAGNOSIS — I10 Essential (primary) hypertension: Secondary | ICD-10-CM

## 2021-03-19 DIAGNOSIS — M19041 Primary osteoarthritis, right hand: Secondary | ICD-10-CM | POA: Diagnosis not present

## 2021-03-19 DIAGNOSIS — R519 Headache, unspecified: Secondary | ICD-10-CM

## 2021-03-19 DIAGNOSIS — G2581 Restless legs syndrome: Secondary | ICD-10-CM

## 2021-03-19 DIAGNOSIS — M19042 Primary osteoarthritis, left hand: Secondary | ICD-10-CM

## 2021-03-19 MED ORDER — NAPROXEN 500 MG PO TABS: 500.0000 mg | ORAL_TABLET | Freq: Two times a day (BID) | ORAL | 3 refills | Status: DC | PRN

## 2021-03-19 MED ORDER — VALSARTAN 80 MG PO TABS: 80.0000 mg | ORAL_TABLET | Freq: Every day | ORAL | 1 refills | Status: DC

## 2021-03-19 MED ORDER — GABAPENTIN 300 MG PO CAPS: 300.0000 mg | ORAL_CAPSULE | Freq: Every evening | ORAL | 1 refills | Status: DC

## 2021-03-19 MED ORDER — CLONAZEPAM 0.5 MG PO TABS
0.5000 mg | ORAL_TABLET | Freq: Every day | ORAL | 1 refills | Status: DC
Start: 2021-03-19 — End: 2021-09-13

## 2021-03-19 MED ORDER — DILTIAZEM HCL ER COATED BEADS 240 MG PO CP24: 240.0000 mg | ORAL_CAPSULE | Freq: Every day | ORAL | 1 refills | Status: DC

## 2021-03-19 NOTE — Progress Notes (Signed)
Established patient visit   Patient: Justin Olsen   DOB: 06-Jan-1966   55 y.o. Male  MRN: [id number] Visit Date: 03/19/2021  Today's healthcare provider: [name], MD   Chief Complaint  Patient presents with   Hypertension   Subjective    HPI  Hypertension, follow-up  BP Readings from Last 3 Encounters:  03/19/21 (!) 153/99  02/19/21 (!) 157/102  01/04/21 (!) 179/115   Wt Readings from Last 3 Encounters:  03/19/21 165 lb 14.4 oz (75.3 kg)  02/19/21 164 lb 9.6 oz (74.7 kg)  01/04/21 164 lb (74.4 kg)     He was last seen for hypertension on 02/19/2021.   BP at that visit was 157/102. Management since that visit includes increasing from 180 to diltiazem (CARDIZEM CD) 240 MG 24 hr capsule.  He reports good compliance with treatment. He is not having side effects.  He is following a Regular diet. He is not exercising. He does smoke.  Use of agents associated with hypertension: none.   Outside blood pressures are 145/ 88. Symptoms: No chest pain No chest pressure  No palpitations No syncope  No dyspnea No orthopnea  No paroxysmal nocturnal dyspnea No lower extremity edema   Pertinent labs: Lab Results  Component Value Date   CHOL 220 (H) 12/14/2020   HDL 42 12/14/2020   LDLCALC 161 (H) 12/14/2020   TRIG 86 12/14/2020   CHOLHDL 5.2 12/14/2020   Lab Results  Component Value Date   NA 138 11/06/2020   K 3.5 11/06/2020   CREATININE 0.82 11/06/2020   GFRNONAA >60 11/06/2020   GLUCOSE 106 (H) 11/06/2020     The 10-year ASCVD risk score (Arnett DK, et al., 2019) is: 18.9%   ---------------------------------------------------------------------------------------------------  He also complain of pain and swelling in joints of both hands for last few years getting progressive worse.    Medications: Outpatient Medications Prior to Visit  Medication Sig   albuterol (VENTOLIN HFA) 108 (90 Base) MCG/ACT inhaler Inhale 2 puffs into the lungs every 6  (six) hours as needed for wheezing or shortness of breath.   Aspirin-Salicylamide-Caffeine (BC HEADACHE POWDER PO) Take by mouth 2 (two) times daily as needed.   clonazePAM (KLONOPIN) 0.5 MG tablet Take 1 tablet (0.5 mg total) by mouth at bedtime.   diltiazem (CARDIZEM CD) 240 MG 24 hr capsule Take 1 capsule (240 mg total) by mouth daily.   fluticasone-salmeterol (ADVAIR DISKUS) 250-50 MCG/ACT AEPB Inhale 1 puff into the lungs daily.   gabapentin (NEURONTIN) 300 MG capsule Take 1 capsule (300 mg total) by mouth every evening.   pantoprazole (PROTONIX) 40 MG tablet Take 1 tablet (40 mg total) by mouth daily.   rosuvastatin (CRESTOR) 20 MG tablet Take 1 tablet (20 mg total) by mouth daily.   No facility-administered medications prior to visit.    Review of Systems  Constitutional:  Negative for appetite change, chills and fever.  Respiratory:  Negative for chest tightness, shortness of breath and wheezing.   Cardiovascular:  Negative for chest pain and palpitations.  Gastrointestinal:  Negative for abdominal pain, nausea and vomiting.      Objective    BP (!) 153/99 (BP Location: Left Arm, Patient Position: Sitting, Cuff Size: Normal)   Pulse 71   Temp 98.5 F (36.9 C) (Oral)   Resp 18   Wt 165 lb 14.4 oz (75.3 kg)   SpO2 99% Comment: room air  BMI 25.23 kg/m   Today's Vitals   03/19/21  1421 03/19/21 1425  BP: (!) 144/94 (!) 153/99  Pulse: 71   Resp: 18   Temp: 98.5 F (36.9 C)   TempSrc: Oral   SpO2: 99%   Weight: 165 lb 14.4 oz (75.3 kg)    Body mass index is 25.23 kg/m.   Physical Exam  General appearance:  Well developed, well nourished male, cooperative and in no acute distress Head: Normocephalic, without obvious abnormality, atraumatic Respiratory: Respirations even and unlabored, normal respiratory rate Extremities: All extremities are intact. Slight swellings of Ips bilaterally.  Skin: Skin color, texture, turgor normal. No rashes seen  Psych: Appropriate  mood and affect. Neurologic: Mental status: Alert, oriented to person, place, and time, thought content appropriate.     Assessment & Plan     1. Primary hypertension Doing much better with increased dose of diltiazem. Headaches now mostly resolved. refill diltiazem (CARDIZEM CD) 240 MG 24 hr capsule; Take 1 capsule (240 mg total) by mouth daily.  Dispense: 90 capsule; Refill: 1  Goal is BP consistently under 140/90. Will add valsartan (DIOVAN) 80 MG tablet; Take 1 tablet (80 mg total) by mouth daily.  Dispense: 30 tablet; Refill: 1  2. Nonintractable episodic headache, unspecified headache type Much better on current medications. Refill - diltiazem (CARDIZEM CD) 240 MG 24 hr capsule; Take 1 capsule (240 mg total) by mouth daily.  Dispense: 90 capsule; Refill: 1 - gabapentin (NEURONTIN) 300 MG capsule; Take 1 capsule (300 mg total) by mouth every evening.  Dispense: 90 capsule; Refill: 1  3. Restless leg syndrome Refill gabapentin (NEURONTIN) 300 MG capsule; Take 1 capsule (300 mg total) by mouth every evening.  Dispense: 90 capsule; Refill: 1  4. RLS (restless legs syndrome) Refill- clonazePAM (KLONOPIN) 0.5 MG tablet; Take 1 tablet (0.5 mg total) by mouth at bedtime.  Dispense: 90 tablet; Refill: 1  5. Primary osteoarthritis of both hands Try - naproxen (NAPROSYN) 500 MG tablet; Take 1 tablet (500 mg total) by mouth 2 (two) times daily as needed.  Dispense: 30 tablet; Refill: 3    Follow up about 6 weeks.       The entirety of the information documented in the History of Present Illness, Review of Systems and Physical Exam were personally obtained by me. Portions of this information were initially documented by the CMA and reviewed by me for thoroughness and accuracy.     [name], MD  [facility] [phone] (phone) [phone] (fax)  [facility]

## 2021-03-29 ENCOUNTER — Telehealth: Payer: Self-pay

## 2021-03-29 NOTE — Telephone Encounter (Signed)
I called and advised patient that an office visit is needed. Our office doesn't not have an available appointments this week. I recommend urgent care or [facility]. Patient states he will look into being seen at one of these options.

## 2021-03-29 NOTE — Telephone Encounter (Signed)
Copied from CRM [id number]. Topic: General - Other >> Mar 29, 2021  8:07 AM Jaquita Rector A wrote: Reason for CRM: Patient called in to inquire of Dr Sherrie Mustache if can please call him in an Rx for chest and head congestion going on about a week. Asking for a call back with an answer please call Ph# [phone]

## 2021-03-30 ENCOUNTER — Ambulatory Visit (INDEPENDENT_AMBULATORY_CARE_PROVIDER_SITE_OTHER): Payer: BC Managed Care – PPO

## 2021-03-30 ENCOUNTER — Encounter: Payer: Self-pay | Admitting: Emergency Medicine

## 2021-03-30 ENCOUNTER — Other Ambulatory Visit: Payer: Self-pay

## 2021-03-30 ENCOUNTER — Ambulatory Visit
Admission: EM | Admit: 2021-03-30 | Discharge: 2021-03-30 | Disposition: A | Discharge status: Home / Self Care | Payer: BC Managed Care – PPO | Attending: Emergency Medicine | Admitting: Emergency Medicine

## 2021-03-30 DIAGNOSIS — J9809 Other diseases of bronchus, not elsewhere classified: Secondary | ICD-10-CM | POA: Diagnosis not present

## 2021-03-30 DIAGNOSIS — J441 Chronic obstructive pulmonary disease with (acute) exacerbation: Secondary | ICD-10-CM | POA: Diagnosis not present

## 2021-03-30 DIAGNOSIS — R918 Other nonspecific abnormal finding of lung field: Secondary | ICD-10-CM | POA: Diagnosis not present

## 2021-03-30 MED ORDER — FLUTICASONE PROPIONATE 50 MCG/ACT NA SUSP: 2.0000 | Freq: Every day | NASAL | 0 refills | Status: DC

## 2021-03-30 MED ORDER — AMOXICILLIN-POT CLAVULANATE 875-125 MG PO TABS: 1.0000 | ORAL_TABLET | Freq: Two times a day (BID) | ORAL | 0 refills | Status: AC

## 2021-03-30 MED ORDER — BENZONATATE 200 MG PO CAPS: 200.0000 mg | ORAL_CAPSULE | Freq: Three times a day (TID) | ORAL | 0 refills | Status: DC | PRN

## 2021-03-30 MED ORDER — PREDNISONE 20 MG PO TABS: 40.0000 mg | ORAL_TABLET | Freq: Every day | ORAL | 0 refills | Status: AC

## 2021-03-30 NOTE — Discharge Instructions (Addendum)
I will contact you if and only if your chest x-ray comes back positive for pneumonia.  2 puffs from your albuterol inhaler every 4 hours for 2 days, then every 6 hours for 2 days, then as needed.  May back off on the albuterol if you start to feel better sooner.  Finish the prednisone and Augmentin.  Tessalon as needed for cough.  Try saline nasal irrigation and Flonase for the nasal congestion/postnasal drip.  May continue Mucinex DM.

## 2021-03-30 NOTE — ED Triage Notes (Signed)
Cough, chest congestion x 1 week

## 2021-03-30 NOTE — ED Provider Notes (Signed)
HPI  SUBJECTIVE:  Justin Olsen is a 55 y.o. male who presents with over a week of head/chest congestion.  He had body aches in the beginning.  He reports nasal congestion, dark rhinorrhea, postnasal drip, wheezing, shortness of breath, cough in the morning that is productive of the same material as his nasal congestion and shortness of breath with normal activities.  No fevers, headaches, sinus pain or pressure, facial swelling, upper dental pain.  He is waking up at night coughing.  No chest pain.  No antibiotics in the past 3 months, antipyretics in the past 6 hours.  He has been taking Mucinex, NyQuil, Mucinex DM.  He has also needed his albuterol more frequently.  He has a past medical history of COPD/emphysema and is compliant with his Advair.  He also has a history of hypertension, CVA, rheumatoid arthritis and continues to smoke.  No history of diabetes.  XTG:GYIRSW, Justin Isaacs, MD   Past Medical History:  Diagnosis Date   Emphysema lung [facility])     History reviewed. No pertinent surgical history.  Family History  Problem Relation Age of Onset   Stroke Mother    Diabetes Mother    Diabetes Father    Heart disease Father    Cancer Maternal Grandfather    Cancer Paternal Grandfather     Social History   Tobacco Use   Smoking status: Every Day    Packs/day: 1.00    Years: 30.00    Pack years: 30.00    Types: Cigarettes   Smokeless tobacco: Never  Vaping Use   Vaping Use: Never used  Substance Use Topics   Alcohol use: Yes    Alcohol/week: 4.0 standard drinks    Types: 4 Cans of beer per week    Comment: 4 beer once a week   Drug use: Not Currently    No current facility-administered medications for this encounter.  Current Outpatient Medications:    albuterol (VENTOLIN HFA) 108 (90 Base) MCG/ACT inhaler, Inhale 2 puffs into the lungs every 6 (six) hours as needed for wheezing or shortness of breath., Disp: 18 g, Rfl: 2   Aspirin-Salicylamide-Caffeine (BC HEADACHE  POWDER PO), Take by mouth 2 (two) times daily as needed., Disp: , Rfl:    clonazePAM (KLONOPIN) 0.5 MG tablet, Take 1 tablet (0.5 mg total) by mouth at bedtime., Disp: 90 tablet, Rfl: 1   diltiazem (CARDIZEM CD) 240 MG 24 hr capsule, Take 1 capsule (240 mg total) by mouth daily., Disp: 90 capsule, Rfl: 1   fluticasone-salmeterol (ADVAIR DISKUS) 250-50 MCG/ACT AEPB, Inhale 1 puff into the lungs daily., Disp: 1 each, Rfl: 3   gabapentin (NEURONTIN) 300 MG capsule, Take 1 capsule (300 mg total) by mouth every evening., Disp: 90 capsule, Rfl: 1   naproxen (NAPROSYN) 500 MG tablet, Take 1 tablet (500 mg total) by mouth 2 (two) times daily as needed., Disp: 30 tablet, Rfl: 3   pantoprazole (PROTONIX) 40 MG tablet, Take 1 tablet (40 mg total) by mouth daily., Disp: 90 tablet, Rfl: 3   rosuvastatin (CRESTOR) 20 MG tablet, Take 1 tablet (20 mg total) by mouth daily., Disp: 90 tablet, Rfl: 3   valsartan (DIOVAN) 80 MG tablet, Take 1 tablet (80 mg total) by mouth daily., Disp: 30 tablet, Rfl: 1  No Known Allergies   ROS  As noted in HPI.   Physical Exam  BP (!) 148/92   Pulse 80   Temp 98.2 F (36.8 C) (Oral)   Resp 18  SpO2 95%   Constitutional: Well developed, well nourished, no acute distress Eyes:  EOMI, conjunctiva normal bilaterally HENT: Normocephalic, atraumatic,mucus membranes moist.  No appreciable nasal congestion.  Normal turbinates.  No maxillary, frontal sinus tenderness.  Positive cobblestoning.  Positive postnasal drip. Respiratory: Normal inspiratory effort, poor air movement, rhonchi right lower lobe Cardiovascular: Normal rate, regular rhythm, no murmurs rubs or gallop GI: nondistended skin: No rash, skin intact Musculoskeletal: no deformities Neurologic: Alert & oriented x 3, no focal neuro deficits Psychiatric: Speech and behavior appropriate   ED Course   Medications - No data to display  Orders Placed This Encounter  Procedures   DG Chest 2 View    Standing  Status:   Standing    Number of Occurrences:   1    Order Specific Question:   Reason for Exam (SYMPTOM  OR DIAGNOSIS REQUIRED)    Answer:   History of COPD, cough x1 week.  Rhonchi right lower lobe.  Rule out pneumonia    No results found for this or any previous visit (from the past 24 hour(s)). DG Chest 2 View  Result Date: 03/30/2021 CLINICAL DATA:  Right lower lobe bronchi EXAM: CHEST - 2 VIEW COMPARISON:  11/06/2020 FINDINGS: The heart size and mediastinal contours are within normal limits. Mild, diffuse interstitial pulmonary opacity. The visualized skeletal structures are unremarkable. IMPRESSION: Mild, diffuse interstitial pulmonary opacity, suggestive of atypical or viral infection or perhaps edema. No focal airspace opacity. Electronically Signed   By: [name] M.D.   On: 03/30/2021 09:46    ED Clinical Impression  1. COPD exacerbation [facility]      ED Assessment/Plan  Will check chest x-ray due to duration of symptoms and focal lung findings.  If positive for pneumonia, will treat as pneumonia.  Otherwise, will treat as a COPD exacerbation with prednisone, regularly scheduled albuterol, Augmentin, Tessalon.  He just picked up a prescription of albuterol and has a spacer already.  Follow-up with PMD as needed.  ER return precautions given.  COVID not sent as it would not change management  Reviewed imaging independently.  Mild diffuse interstitial pulmonary opacities suggestive of atypical viral infection or perhaps edema.  No focal airspace opacity.  See radiology report for full details.  Staff discussed x-ray with patient, and encouraged him to seek medical attention if not getting any better after finishing the antibiotics.  Patient agrees with plan.  Discussed imaging, MDM, treatment plan, and plan for follow-up with patient. Discussed sn/sx that should prompt return to the ED. patient agrees with plan.   No orders of the defined types were placed in this  encounter.     *This clinic note was created using Dragon dictation software. Therefore, there may be occasional mistakes despite careful proofreading.  ?    [name], MD 03/31/21 [phone]

## 2021-05-04 ENCOUNTER — Other Ambulatory Visit: Payer: Self-pay | Admitting: Family Medicine

## 2021-05-04 DIAGNOSIS — R0600 Dyspnea, unspecified: Secondary | ICD-10-CM

## 2021-05-04 MED ORDER — VALSARTAN 80 MG PO TABS: 80.0000 mg | ORAL_TABLET | Freq: Every day | ORAL | 2 refills | Status: DC

## 2021-05-04 MED ORDER — FLUTICASONE-SALMETEROL 250-50 MCG/ACT IN AEPB: 1.0000 | INHALATION_SPRAY | Freq: Every day | RESPIRATORY_TRACT | 3 refills | Status: DC

## 2021-05-04 NOTE — Telephone Encounter (Signed)
Requested Prescriptions  Pending Prescriptions Disp Refills  . fluticasone-salmeterol (ADVAIR DISKUS) 250-50 MCG/ACT AEPB 1 each 3    Sig: Inhale 1 puff into the lungs daily.     Pulmonology:  Combination Products Passed - 05/04/2021  4:43 PM      Passed - Valid encounter within last 12 months    Recent Outpatient Visits          1 month ago Primary osteoarthritis of both hands   [facility] Malva Limes, MD   2 months ago Primary hypertension   [facility] Malva Limes, MD   4 months ago Primary hypertension   [facility] Malva Limes, MD   4 months ago Gastroesophageal reflux disease, unspecified whether esophagitis present   [facility] Malva Limes, MD      Future Appointments            In 1 month Fisher, [name], MD [facility]           . valsartan (DIOVAN) 80 MG tablet 30 tablet 2    Sig: Take 1 tablet (80 mg total) by mouth daily.     Cardiovascular:  Angiotensin Receptor Blockers Failed - 05/04/2021  4:43 PM      Failed - Last BP in normal range    BP Readings from Last 1 Encounters:  03/30/21 (!) 148/92         Passed - Cr in normal range and within 180 days    Creatinine, Ser  Date Value Ref Range Status  11/06/2020 0.82 0.61 - 1.24 mg/dL Final         Passed - K in normal range and within 180 days    Potassium  Date Value Ref Range Status  11/06/2020 3.5 3.5 - 5.1 mmol/L Final         Passed - Patient is not pregnant      Passed - Valid encounter within last 6 months    Recent Outpatient Visits          1 month ago Primary osteoarthritis of both hands   [facility] Malva Limes, MD   2 months ago Primary hypertension   [facility] Malva Limes, MD   4 months ago Primary hypertension   [facility] Malva Limes, MD   4 months ago Gastroesophageal reflux disease, unspecified whether esophagitis  present   [facility] Malva Limes, MD      Future Appointments            In 1 month Fisher, [name], MD [facility], [facility]

## 2021-05-04 NOTE — Telephone Encounter (Signed)
Requested Prescriptions  Pending Prescriptions Disp Refills  . albuterol (VENTOLIN HFA) 108 (90 Base) MCG/ACT inhaler [Pharmacy Med Name: ALBUTEROL HFA 90 MCG INHALER] 8.5 g 0    Sig: INHALE TWO PUFFS BY MOUTH EVERY 6 HOURS AS NEEDED FOR WHEEZING OR SHORTNESS OF BREATH     Pulmonology:  Beta Agonists Failed - 05/04/2021  6:21 AM      Failed - One inhaler should last at least one month. If the patient is requesting refills earlier, contact the patient to check for uncontrolled symptoms.      Passed - Valid encounter within last 12 months    Recent Outpatient Visits          1 month ago Primary osteoarthritis of both hands   [facility] Malva Limes, MD   2 months ago Primary hypertension   [facility] Malva Limes, MD   4 months ago Primary hypertension   [facility] Malva Limes, MD   4 months ago Gastroesophageal reflux disease, unspecified whether esophagitis present   [facility] Malva Limes, MD      Future Appointments            In 3 days Fisher, [name], MD [facility], [facility]

## 2021-05-04 NOTE — Telephone Encounter (Signed)
Medication Refill - Medication: Valsartan, advair  Has the patient contacted their pharmacy? No. Pt states that he did not have refills. Please advise.  (Agent: If no, request that the patient contact the pharmacy for the refill. If patient does not wish to contact the pharmacy document the reason why and proceed with request.) (Agent: If yes, when and what did the pharmacy advise?)  Preferred Pharmacy (with phone number or street name):  [facility] [id number] [address], [place] - [address]  [address] [place] [address]  Phone: [phone] Fax: [phone]  Hours: Not open 24 hours   Has the patient been seen for an appointment in the last year OR does the patient have an upcoming appointment? Yes.   06/15/21  Agent: Please be advised that RX refills may take up to 3 business days. We ask that you follow-up with your pharmacy.

## 2021-05-07 ENCOUNTER — Ambulatory Visit: Payer: BC Managed Care – PPO | Admitting: Family Medicine

## 2021-05-11 ENCOUNTER — Other Ambulatory Visit: Payer: Self-pay | Admitting: Family Medicine

## 2021-05-11 NOTE — Telephone Encounter (Signed)
Called pharmacy and would not dial out. Called patient to review Rx Diovan regarding refills. No answer from patient, left message on voicemail to call clinic back #[phone] to see if he has picked up medication from pharmacy.

## 2021-05-11 NOTE — Telephone Encounter (Signed)
Contacted pharmacy and patient has not picked up medication. Rx request signed 05/04/21. Called patient left VM , medication is ready for pick up . Requested Prescriptions  Refused Prescriptions Disp Refills  . valsartan (DIOVAN) 80 MG tablet [Pharmacy Med Name: VALSARTAN 80 MG TABLET] 30 tablet 2    Sig: TAKE ONE TABLET BY MOUTH DAILY     Cardiovascular:  Angiotensin Receptor Blockers Failed - 05/11/2021  8:11 AM      Failed - Cr in normal range and within 180 days    Creatinine, Ser  Date Value Ref Range Status  11/06/2020 0.82 0.61 - 1.24 mg/dL Final         Failed - K in normal range and within 180 days    Potassium  Date Value Ref Range Status  11/06/2020 3.5 3.5 - 5.1 mmol/L Final         Failed - Last BP in normal range    BP Readings from Last 1 Encounters:  03/30/21 (!) 148/92         Passed - Patient is not pregnant      Passed - Valid encounter within last 6 months    Recent Outpatient Visits          1 month ago Primary osteoarthritis of both hands   [facility] Malva Limes, MD   2 months ago Primary hypertension   [facility] Malva Limes, MD   4 months ago Primary hypertension   [facility] Malva Limes, MD   5 months ago Gastroesophageal reflux disease, unspecified whether esophagitis present   [facility] Malva Limes, MD      Future Appointments            In 1 month Fisher, [name], MD [facility], [facility]

## 2021-05-11 NOTE — Telephone Encounter (Signed)
Contacted pharmacy at [facility] and patient has medication ready for pick up . Called patient no answer, left voicemail medication ready for pick up.

## 2021-06-15 ENCOUNTER — Encounter: Payer: Self-pay | Admitting: Family Medicine

## 2021-06-15 ENCOUNTER — Other Ambulatory Visit: Payer: Self-pay

## 2021-06-15 ENCOUNTER — Ambulatory Visit: Payer: BC Managed Care – PPO | Admitting: Family Medicine

## 2021-06-15 VITALS — BP 132/82 | HR 65 | Temp 97.9°F | Resp 16 | Wt 166.4 lb

## 2021-06-15 DIAGNOSIS — M19042 Primary osteoarthritis, left hand: Secondary | ICD-10-CM

## 2021-06-15 DIAGNOSIS — I10 Essential (primary) hypertension: Secondary | ICD-10-CM | POA: Diagnosis not present

## 2021-06-15 DIAGNOSIS — K219 Gastro-esophageal reflux disease without esophagitis: Secondary | ICD-10-CM | POA: Diagnosis not present

## 2021-06-15 DIAGNOSIS — M19041 Primary osteoarthritis, right hand: Secondary | ICD-10-CM | POA: Diagnosis not present

## 2021-06-15 NOTE — Progress Notes (Signed)
Established patient visit   Patient: Justin Olsen   DOB: May 15, 1966   55 y.o. Male  MRN: [id number] Visit Date: 06/15/2021  Today's healthcare provider: [name], MD   Chief Complaint  Patient presents with   Hypertension   Subjective    Hypertension Pertinent negatives include no chest pain, palpitations or shortness of breath.   Hypertension, follow-up  BP Readings from Last 3 Encounters:  03/30/21 (!) 148/92  03/19/21 (!) 153/99  02/19/21 (!) 157/102   Wt Readings from Last 3 Encounters:  03/19/21 165 lb 14.4 oz (75.3 kg)  02/19/21 164 lb 9.6 oz (74.7 kg)  01/04/21 164 lb (74.4 kg)     He was last seen for hypertension 3 months ago.  BP at that visit was 157/99. Management since that visit includes increased diltiazem from 180 to 240mg  daily .  He reports excellent compliance with treatment. He is not having side effects.  He is following a Low Sodium diet. He is exercising. He does smoke.  Use of agents associated with hypertension: none.   Outside blood pressures are 116-130/75-80s. Symptoms: Yes chest pain No chest pressure  No palpitations No syncope  No dyspnea No orthopnea  No paroxysmal nocturnal dyspnea No lower extremity edema   Pertinent labs: Lab Results  Component Value Date   CHOL 220 (H) 12/14/2020   HDL 42 12/14/2020   LDLCALC 161 (H) 12/14/2020   TRIG 86 12/14/2020   CHOLHDL 5.2 12/14/2020   Lab Results  Component Value Date   NA 138 11/06/2020   K 3.5 11/06/2020   CREATININE 0.82 11/06/2020   GFRNONAA >60 11/06/2020   GLUCOSE 106 (H) 11/06/2020     The 10-year ASCVD risk score (Arnett DK, et al., 2019) is: 17.9%   ---------------------------------------------------------------------------------------------------   Medications: Outpatient Medications Prior to Visit  Medication Sig   albuterol (VENTOLIN HFA) 108 (90 Base) MCG/ACT inhaler INHALE TWO PUFFS BY MOUTH EVERY 6 HOURS AS NEEDED FOR WHEEZING OR SHORTNESS  OF BREATH   Aspirin-Salicylamide-Caffeine (BC HEADACHE POWDER PO) Take by mouth 2 (two) times daily as needed.   clonazePAM (KLONOPIN) 0.5 MG tablet Take 1 tablet (0.5 mg total) by mouth at bedtime.   diltiazem (CARDIZEM CD) 240 MG 24 hr capsule Take 1 capsule (240 mg total) by mouth daily.   fluticasone-salmeterol (ADVAIR DISKUS) 250-50 MCG/ACT AEPB Inhale 1 puff into the lungs daily.   gabapentin (NEURONTIN) 300 MG capsule Take 1 capsule (300 mg total) by mouth every evening.   naproxen (NAPROSYN) 500 MG tablet Take 1 tablet (500 mg total) by mouth 2 (two) times daily as needed.   pantoprazole (PROTONIX) 40 MG tablet Take 1 tablet (40 mg total) by mouth daily.   valsartan (DIOVAN) 80 MG tablet Take 1 tablet (80 mg total) by mouth daily.   fluticasone (FLONASE) 50 MCG/ACT nasal spray Place 2 sprays into both nostrils daily. (Patient not taking: Reported on 06/15/2021)   rosuvastatin (CRESTOR) 20 MG tablet Take 1 tablet (20 mg total) by mouth daily.   [DISCONTINUED] benzonatate (TESSALON) 200 MG capsule Take 1 capsule (200 mg total) by mouth 3 (three) times daily as needed for cough. (Patient not taking: Reported on 06/15/2021)   No facility-administered medications prior to visit.    Review of Systems  Constitutional:  Negative for activity change and fatigue.  Respiratory:  Negative for chest tightness and shortness of breath.   Cardiovascular:  Negative for chest pain and palpitations.  Objective    BP 132/82 (BP Location: Left Arm, Patient Position: Sitting, Cuff Size: Large)    Pulse 65    Temp 97.9 F (36.6 C) (Oral)    Resp 16    Wt 166 lb 6.4 oz (75.5 kg)    SpO2 99%    BMI 25.30 kg/m  BP Readings from Last 3 Encounters:  03/30/21 (!) 148/92  03/19/21 (!) 153/99  02/19/21 (!) 157/102   Wt Readings from Last 3 Encounters:  03/19/21 165 lb 14.4 oz (75.3 kg)  02/19/21 164 lb 9.6 oz (74.7 kg)  01/04/21 164 lb (74.4 kg)      Physical Exam    General: Appearance:      Overweight male in no acute distress  Eyes:    PERRL, conjunctiva/corneas clear, EOM's intact       Lungs:     Clear to auscultation bilaterally, respirations unlabored  Heart:    Normal heart rate. Normal rhythm. No murmurs, rubs, or gallops.    MS:   All extremities are intact.    Neurologic:   Awake, alert, oriented x 3. No apparent focal neurological defect.         Assessment & Plan     1. Benign essential HTN Well controlled.  Continue current medications.  Follow up about 5 months.   2. Gastroesophageal reflux disease without esophagitis Has gotten a little worse since increasing CCB. Continues on pantoprazole. He is usually able to control sx with diet. Consider increasing ARB and decreasing CCB if this continues to be a problem.     3. Primary osteoarthritis of both hands Continue naproxen QD to BID as needed   Future Appointments  Date Time Provider Department Center  10/15/2021 11:00 AM Malva Limes, MD [facility]         The entirety of the information documented in the History of Present Illness, Review of Systems and Physical Exam were personally obtained by me. Portions of this information were initially documented by the CMA and reviewed by me for thoroughness and accuracy.     [name], MD  [facility] [phone] (phone) [phone] (fax)  [facility]

## 2021-06-15 NOTE — Patient Instructions (Signed)
.   Please review the attached list of medications and notify my office if there are any errors.   . Please bring all of your medications to every appointment so we can make sure that our medication list is the same as yours.   

## 2021-06-29 ENCOUNTER — Other Ambulatory Visit: Payer: Self-pay | Admitting: Family Medicine

## 2021-07-06 ENCOUNTER — Other Ambulatory Visit
Admission: RE | Admit: 2021-07-06 | Discharge: 2021-07-06 | Disposition: A | Discharge status: Home / Self Care | Payer: BC Managed Care – PPO | Source: Ambulatory Visit | Attending: Cardiovascular Disease | Admitting: Cardiovascular Disease

## 2021-07-06 DIAGNOSIS — E7800 Pure hypercholesterolemia, unspecified: Secondary | ICD-10-CM

## 2021-07-06 LAB — LIPID PANEL
Cholesterol: 140 mg/dL (ref 0–200)
HDL: 37 mg/dL — ABNORMAL LOW (ref >40)
LDL Cholesterol: 31 mg/dL (ref 0–99)
Total CHOL/HDL Ratio: 3.8 RATIO
Triglycerides: 361 mg/dL — ABNORMAL HIGH (ref <150)
VLDL: 72 mg/dL — ABNORMAL HIGH (ref 0–40)

## 2021-07-08 ENCOUNTER — Other Ambulatory Visit: Payer: Self-pay | Admitting: Family Medicine

## 2021-07-08 DIAGNOSIS — I10 Essential (primary) hypertension: Secondary | ICD-10-CM

## 2021-07-08 MED ORDER — VALSARTAN 80 MG PO TABS: 80.0000 mg | ORAL_TABLET | Freq: Every day | ORAL | 2 refills | Status: DC

## 2021-07-17 ENCOUNTER — Other Ambulatory Visit: Payer: Self-pay | Admitting: Family Medicine

## 2021-07-17 DIAGNOSIS — R0600 Dyspnea, unspecified: Secondary | ICD-10-CM

## 2021-07-18 NOTE — Telephone Encounter (Signed)
Requested Prescriptions  Pending Prescriptions Disp Refills   albuterol (VENTOLIN HFA) 108 (90 Base) MCG/ACT inhaler [Pharmacy Med Name: ALBUTEROL HFA 90 MCG INHALER] 8.5 g 1    Sig: INHALE TWO PUFFS BY MOUTH EVERY 6 HOURS AS NEEDED FOR WHEEZING OR FOR SHORTNESS OF BREATH     Pulmonology:  Beta Agonists Failed - 07/17/2021  4:09 PM      Failed - One inhaler should last at least one month. If the patient is requesting refills earlier, contact the patient to check for uncontrolled symptoms.      Passed - Valid encounter within last 12 months    Recent Outpatient Visits          1 month ago Benign essential HTN   [facility] Birdie Sons, MD   4 months ago Primary osteoarthritis of both hands   [facility] Birdie Sons, MD   4 months ago Primary hypertension   [facility] Birdie Sons, MD   6 months ago Primary hypertension   [facility] Birdie Sons, MD   7 months ago Gastroesophageal reflux disease, unspecified whether esophagitis present   [facility] Birdie Sons, MD      Future Appointments            In 2 months Fisher, Kirstie Peri, MD [facility] [place]

## 2021-09-10 ENCOUNTER — Ambulatory Visit: Payer: Self-pay

## 2021-09-10 NOTE — Telephone Encounter (Signed)
Summary: dizziness  ? High priority dizzy spells.  ? ?----- Message from Glean Salen sent at 09/10/2021 11:22 AM EDT -----  ?Pt called in with dizziness, has had for the last 2 days. Please call back   ?  ?Called pt - LMOM ?

## 2021-09-10 NOTE — Telephone Encounter (Signed)
Summary: dizziness  ? High priority dizzy spells.  ? ?----- Message from Glean Salen sent at 09/10/2021 11:22 AM EDT -----  ?Pt called in with dizziness, has had for the last 2 days. Please call back   ?  ?Called pt - LMOM to return call. ?

## 2021-09-10 NOTE — Telephone Encounter (Signed)
Summary: dizziness  ? High priority dizzy spells.  ? ?----- Message from Glean Salen sent at 09/10/2021 11:22 AM EDT -----  ?Pt called in with dizziness, has had for the last 2 days. Please call back   ?  ?Returned pt's call - LMOM - Forwarded to Clinic. ?

## 2021-09-13 ENCOUNTER — Ambulatory Visit: Payer: BC Managed Care – PPO | Admitting: Family Medicine

## 2021-09-13 ENCOUNTER — Encounter: Payer: Self-pay | Admitting: Family Medicine

## 2021-09-13 ENCOUNTER — Other Ambulatory Visit: Payer: Self-pay

## 2021-09-13 VITALS — BP 106/76 | HR 64 | Temp 98.6°F | Resp 16 | Ht 68.0 in | Wt 159.0 lb

## 2021-09-13 DIAGNOSIS — I10 Essential (primary) hypertension: Secondary | ICD-10-CM | POA: Diagnosis not present

## 2021-09-13 DIAGNOSIS — R110 Nausea: Secondary | ICD-10-CM

## 2021-09-13 DIAGNOSIS — G2581 Restless legs syndrome: Secondary | ICD-10-CM

## 2021-09-13 DIAGNOSIS — R42 Dizziness and giddiness: Secondary | ICD-10-CM

## 2021-09-13 DIAGNOSIS — R519 Headache, unspecified: Secondary | ICD-10-CM

## 2021-09-13 MED ORDER — CLONAZEPAM 0.5 MG PO TABS: 0.5000 mg | ORAL_TABLET | Freq: Every day | ORAL | 1 refills | Status: DC

## 2021-09-13 MED ORDER — VALSARTAN 80 MG PO TABS: 40.0000 mg | ORAL_TABLET | Freq: Every day | ORAL | Status: DC

## 2021-09-13 MED ORDER — GABAPENTIN 300 MG PO CAPS: 300.0000 mg | ORAL_CAPSULE | Freq: Every evening | ORAL | 1 refills | Status: DC

## 2021-09-13 NOTE — Patient Instructions (Addendum)
Please review the attached list of medications and notify my office if there are any errors.  ? ?Cut back on  valsartan to 1/2 tablet daily until you are feeling better ? ?You can go to any [facility] to get your blood drawn. This is one at [facility] on [address] across from [address]. There is one on [address], and one at  [employer] near the [place] ? ?

## 2021-09-13 NOTE — Progress Notes (Signed)
?  ? ?I,[name],acting as a scribe for [name], MD.,have documented all relevant documentation on the behalf of [name], MD,as directed by  [name], MD while in the presence of [name], MD.  ? ?Established patient visit ? ? ?Patient: Justin Olsen   DOB: 1965/12/20   56 y.o. Male  MRN: [id number] ?Visit Date: 09/13/2021 ? ?Today's healthcare provider: [name], MD  ? ?Chief Complaint  ?Patient presents with  ? Dizziness  ? ?Subjective  ?  ?Dizziness ?This is a new problem. The current episode started in the past 7 days (last Wednesday). Episode frequency: off and on been having dizzy spells. The problem has been gradually worsening. Associated symptoms include chest pain, fatigue, headaches (in the last few days), nausea and a visual change. Pertinent negatives include no abdominal pain, change in bowel habit, congestion, coughing, fever, sore throat, urinary symptoms or vertigo. Associated symptoms comments: Chest pressure and some pain. Nothing aggravates the symptoms. He has tried rest for the symptoms. The treatment provided no relief.   ? ? ?Medications: ?Outpatient Medications Prior to Visit  ?Medication Sig  ? albuterol (VENTOLIN HFA) 108 (90 Base) MCG/ACT inhaler INHALE TWO PUFFS BY MOUTH EVERY 6 HOURS AS NEEDED FOR WHEEZING OR FOR SHORTNESS OF BREATH  ? Aspirin-Salicylamide-Caffeine (BC HEADACHE POWDER PO) Take by mouth 2 (two) times daily as needed.  ? clonazePAM (KLONOPIN) 0.5 MG tablet Take 1 tablet (0.5 mg total) by mouth at bedtime.  ? diltiazem (CARDIZEM CD) 240 MG 24 hr capsule Take 1 capsule (240 mg total) by mouth daily.  ? fluticasone-salmeterol (ADVAIR DISKUS) 250-50 MCG/ACT AEPB Inhale 1 puff into the lungs daily.  ? gabapentin (NEURONTIN) 300 MG capsule Take 1 capsule (300 mg total) by mouth every evening.  ? naproxen (NAPROSYN) 500 MG tablet TAKE ONE TABLET BY MOUTH TWICE A DAY AS NEEDED  ? pantoprazole (PROTONIX) 40 MG tablet Take 1 tablet (40 mg total)  by mouth daily.  ? rosuvastatin (CRESTOR) 20 MG tablet Take 1 tablet (20 mg total) by mouth daily.  ? valsartan (DIOVAN) 80 MG tablet Take 1 tablet (80 mg total) by mouth daily.  ? fluticasone (FLONASE) 50 MCG/ACT nasal spray Place 2 sprays into both nostrils daily. (Patient not taking: Reported on 09/13/2021)  ? ?No facility-administered medications prior to visit.  ? ? ?Review of Systems  ?Constitutional:  Positive for fatigue. Negative for fever.  ?HENT:  Negative for congestion and sore throat.   ?Respiratory:  Negative for cough.   ?Cardiovascular:  Positive for chest pain.  ?Gastrointestinal:  Positive for nausea. Negative for abdominal pain and change in bowel habit.  ?Neurological:  Positive for dizziness and headaches (in the last few days). Negative for vertigo.  ? ? ?  Objective  ?  ?BP 106/76 (BP Location: Left Arm, Patient Position: Sitting, Cuff Size: Normal)   Pulse 64   Temp 98.6 ?F (37 ?C) (Oral)   Resp 16   Ht 5\' 8"  (1.727 m)   Wt 159 lb (72.1 kg)   BMI 24.18 kg/m?  ? ?Orthostatic VS for the past 24 hrs (Last 3 readings): ? BP- Lying Pulse- Lying BP- Sitting Pulse- Sitting BP- Standing at 0 minutes Pulse- Standing at 0 minutes BP- Standing at 3 minutes Pulse- Standing at 3 minutes  ?09/13/21 1331 119/74 60 106/81 64 105/78 66 106/83 66  ?  ? ? ? ?Physical Exam  ? ?General: Appearance:    Well developed, well nourished male in no acute  distress  ?Eyes:    PERRL, conjunctiva/corneas clear, EOM's intact       ?Lungs:     Clear to auscultation bilaterally, respirations unlabored  ?Heart:    Normal heart rate. Normal rhythm. No murmurs, rubs, or gallops.    ?MS:   All extremities are intact.    ?Neurologic:   Awake, alert, oriented x 3. No apparent focal neurological defect.   ?   ?  ? Assessment & Plan  ?  ? ?1. Dizziness ? ?- EKG 12-Lead ? ?2. Benign essential HTN ?Well controlled.  Continue current medications.  refill valsartan (DIOVAN) 80 MG tablet; Take 0.5 tablets (40 mg total) by mouth  daily. ? ?3. Nausea ? ?- CBC ?- Comprehensive metabolic panel ?- Amylase ? ?Consider abdominal imaging ? ?4. Nonintractable episodic headache, unspecified headache type ?Much better on gabapentin, clonazepam and since BP controlled. Refill. gabapentin (NEURONTIN) 300 MG capsule; Take 1 capsule (300 mg total) by mouth every evening.  Dispense: 90 capsule; Refill: 1 ? ?5. Restless leg syndrome ?refill gabapentin (NEURONTIN) 300 MG capsule; Take 1 capsule (300 mg total) by mouth every evening.  Dispense: 90 capsule; Refill: 1 ? ?- clonazePAM (KLONOPIN) 0.5 MG tablet; Take 1 tablet (0.5 mg total) by mouth at bedtime.  Dispense: 90 tablet; Refill: 1  ? ?  ?   ? ?The entirety of the information documented in the History of Present Illness, Review of Systems and Physical Exam were personally obtained by me. Portions of this information were initially documented by the CMA and reviewed by me for thoroughness and accuracy.   ? ? ?[name], MD  ?[facility] ?[phone] (phone) ?[phone] (fax) ? ? [facility]  ?

## 2021-09-13 NOTE — Telephone Encounter (Signed)
Pt called and scheduled for today.

## 2021-09-14 ENCOUNTER — Other Ambulatory Visit: Payer: Self-pay | Admitting: Family Medicine

## 2021-09-14 ENCOUNTER — Telehealth: Payer: Self-pay

## 2021-09-14 DIAGNOSIS — R42 Dizziness and giddiness: Secondary | ICD-10-CM

## 2021-09-14 DIAGNOSIS — R7401 Elevation of levels of liver transaminase levels: Secondary | ICD-10-CM

## 2021-09-14 DIAGNOSIS — R110 Nausea: Secondary | ICD-10-CM

## 2021-09-14 LAB — CBC
Hematocrit: 41.4 % (ref 37.5–51.0)
Hemoglobin: 14.4 g/dL (ref 13.0–17.7)
MCH: 32.1 pg (ref 26.6–33.0)
MCHC: 34.8 g/dL (ref 31.5–35.7)
MCV: 92 fL (ref 79–97)
Platelets: 210 10*3/uL (ref 150–450)
RBC: 4.49 x10E6/uL (ref 4.14–5.80)
RDW: 13.1 % (ref 11.6–15.4)
WBC: 9.6 10*3/uL (ref 3.4–10.8)

## 2021-09-14 LAB — COMPREHENSIVE METABOLIC PANEL
ALT: 59 IU/L — ABNORMAL HIGH (ref 0–44)
AST: 52 IU/L — ABNORMAL HIGH (ref 0–40)
Albumin/Globulin Ratio: 1.9 (ref 1.2–2.2)
Albumin: 4.5 g/dL (ref 3.8–4.9)
Alkaline Phosphatase: 80 IU/L (ref 44–121)
BUN/Creatinine Ratio: 15 (ref 9–20)
BUN: 19 mg/dL (ref 6–24)
Bilirubin Total: 0.3 mg/dL (ref 0.0–1.2)
CO2: 22 mmol/L (ref 20–29)
Calcium: 9.2 mg/dL (ref 8.7–10.2)
Chloride: 104 mmol/L (ref 96–106)
Creatinine, Ser: 1.27 mg/dL (ref 0.76–1.27)
Globulin, Total: 2.4 g/dL (ref 1.5–4.5)
Glucose: 98 mg/dL (ref 70–99)
Potassium: 4.1 mmol/L (ref 3.5–5.2)
Sodium: 139 mmol/L (ref 134–144)
Total Protein: 6.9 g/dL (ref 6.0–8.5)
eGFR: 68 mL/min/{1.73_m2} (ref >59)

## 2021-09-14 LAB — AMYLASE: Amylase: 46 U/L (ref 31–110)

## 2021-09-14 NOTE — Telephone Encounter (Signed)
Copied from CRM [id number]. Topic: General - Other ?>> Sep 14, 2021 10:56 AM Gaetana Michaelis A wrote: ?Reason for CRM: The patient has returned missed call from the practice ? ?The patient would like to speak with a member of staff when available about ultrasound imaging scheduling  ? ?Please contact further ?

## 2021-09-22 ENCOUNTER — Ambulatory Visit
Admission: RE | Admit: 2021-09-22 | Discharge: 2021-09-22 | Disposition: A | Discharge status: Home / Self Care | Payer: BC Managed Care – PPO | Source: Ambulatory Visit | Attending: Family Medicine | Admitting: Family Medicine

## 2021-09-22 DIAGNOSIS — R7401 Elevation of levels of liver transaminase levels: Secondary | ICD-10-CM | POA: Diagnosis not present

## 2021-09-22 DIAGNOSIS — R748 Abnormal levels of other serum enzymes: Secondary | ICD-10-CM | POA: Diagnosis not present

## 2021-09-22 DIAGNOSIS — I700 Atherosclerosis of aorta: Secondary | ICD-10-CM | POA: Diagnosis not present

## 2021-09-22 DIAGNOSIS — K824 Cholesterolosis of gallbladder: Secondary | ICD-10-CM | POA: Diagnosis not present

## 2021-09-22 DIAGNOSIS — R42 Dizziness and giddiness: Secondary | ICD-10-CM | POA: Diagnosis not present

## 2021-09-22 DIAGNOSIS — R110 Nausea: Secondary | ICD-10-CM | POA: Diagnosis not present

## 2021-10-04 ENCOUNTER — Telehealth: Payer: Self-pay

## 2021-10-04 DIAGNOSIS — R7401 Elevation of levels of liver transaminase levels: Secondary | ICD-10-CM

## 2021-10-04 NOTE — Telephone Encounter (Signed)
-----   Message from Malva Limes, MD sent at 09/29/2021  9:03 PM EDT ----- ?Ultrasound shows very small gallbladder polyp which is benign, but no liver problems.  ?Need to check hepatitis A antibodies, Hepatitis B core antibody and hepatitis C antibody and hepatic function panel due to elevate liver transaminases.  ?

## 2021-10-04 NOTE — Telephone Encounter (Signed)
Patient aware. Labs ordered and placed up front. ?

## 2021-10-05 DIAGNOSIS — G2581 Restless legs syndrome: Secondary | ICD-10-CM | POA: Insufficient documentation

## 2021-10-11 DIAGNOSIS — R7401 Elevation of levels of liver transaminase levels: Secondary | ICD-10-CM | POA: Diagnosis not present

## 2021-10-12 LAB — HEPATITIS C ANTIBODY: Hep C Virus Ab: NONREACTIVE

## 2021-10-12 LAB — HEPATIC FUNCTION PANEL
ALT: 15 IU/L (ref 0–44)
AST: 14 IU/L (ref 0–40)
Albumin: 4.8 g/dL (ref 3.8–4.9)
Alkaline Phosphatase: 76 IU/L (ref 44–121)
Bilirubin Total: 0.3 mg/dL (ref 0.0–1.2)
Bilirubin, Direct: 0.1 mg/dL (ref 0.00–0.40)
Total Protein: 7 g/dL (ref 6.0–8.5)

## 2021-10-12 LAB — HEPATITIS A ANTIBODY, TOTAL: hep A Total Ab: NEGATIVE

## 2021-10-12 LAB — HEPATITIS B CORE ANTIBODY, TOTAL: Hep B Core Total Ab: NEGATIVE

## 2021-10-15 ENCOUNTER — Ambulatory Visit: Payer: BC Managed Care – PPO | Admitting: Family Medicine

## 2021-10-15 ENCOUNTER — Encounter: Payer: Self-pay | Admitting: Family Medicine

## 2021-10-15 VITALS — BP 118/78 | HR 95 | Temp 98.6°F | Resp 16 | Wt 153.4 lb

## 2021-10-15 DIAGNOSIS — M19041 Primary osteoarthritis, right hand: Secondary | ICD-10-CM

## 2021-10-15 DIAGNOSIS — M19042 Primary osteoarthritis, left hand: Secondary | ICD-10-CM | POA: Diagnosis not present

## 2021-10-15 DIAGNOSIS — R42 Dizziness and giddiness: Secondary | ICD-10-CM

## 2021-10-15 DIAGNOSIS — I10 Essential (primary) hypertension: Secondary | ICD-10-CM

## 2021-10-15 MED ORDER — NAPROXEN 500 MG PO TABS: 500.0000 mg | ORAL_TABLET | Freq: Two times a day (BID) | ORAL | 3 refills | Status: DC | PRN

## 2021-10-15 MED ORDER — LISINOPRIL 5 MG PO TABS: 5.0000 mg | ORAL_TABLET | Freq: Every day | ORAL | 3 refills | Status: DC

## 2021-10-15 NOTE — Progress Notes (Signed)
?  ? ?I,Sulibeya S Dimas,acting as a scribe for Lelon Huh, MD.,have documented all relevant documentation on the behalf of Lelon Huh, MD,as directed by  Lelon Huh, MD while in the presence of Lelon Huh, MD. ? ? ? ?Established patient visit ? ? ?Patient: Justin Olsen   DOB: 01-17-1966   56 y.o. Male  MRN: [id number] ?Visit Date: 10/15/2021 ? ?Today's healthcare provider: Lelon Huh, MD  ? ?Chief Complaint  ?Patient presents with  ? Hypertension  ? ?Subjective  ?  ?HPI  ?Hypertension, follow-up ? ?BP Readings from Last 3 Encounters:  ?10/15/21 118/78  ?09/13/21 106/76  ?06/15/21 132/82  ? Wt Readings from Last 3 Encounters:  ?10/15/21 153 lb 6.4 oz (69.6 kg)  ?09/13/21 159 lb (72.1 kg)  ?06/15/21 166 lb 6.4 oz (75.5 kg)  ?  ? ?He was last seen for hypertension 1  month  ago.  ?BP at that visit was 106/76. Management since that visit includes reducing valsartan to 1/2 tablet daily due to dizziness.  ? ?He reports fair compliance with treatment. He cut dose down to 1/2 tablet daily for about a week, but didn't feel like dizziness improved. Patient reports he has been taking full dose of valsartan. Patient reports he is still having dizziness. ?He is not having side effects.  ?He is following a Regular diet. ?He is exercising. ?He does smoke. ? ?Use of agents associated with hypertension: none.  ? ?Outside blood pressures are elevated 138/80, 158/95. ?Symptoms: ?Yes chest pain Yes chest pressure  ?No palpitations No syncope  ?Yes dyspnea No orthopnea  ?No paroxysmal nocturnal dyspnea No lower extremity edema  ? ?Pertinent labs ?Lab Results  ?Component Value Date  ? CHOL 140 07/06/2021  ? HDL 37 (L) 07/06/2021  ? [place] 31 07/06/2021  ? TRIG 361 (H) 07/06/2021  ? CHOLHDL 3.8 07/06/2021  ? Lab Results  ?Component Value Date  ? NA 139 09/13/2021  ? K 4.1 09/13/2021  ? CREATININE 1.27 09/13/2021  ? EGFR 68 09/13/2021  ? GLUCOSE 98 09/13/2021  ?  ? ?The 10-year ASCVD risk score (Arnett DK, et al., 2019)  is: 8.6% ? ?---------------------------------------------------------------------------------------------------  ? ?Medications: ?Outpatient Medications Prior to Visit  ?Medication Sig  ? albuterol (VENTOLIN HFA) 108 (90 Base) MCG/ACT inhaler INHALE TWO PUFFS BY MOUTH EVERY 6 HOURS AS NEEDED FOR WHEEZING OR FOR SHORTNESS OF BREATH  ? Aspirin-Salicylamide-Caffeine (BC HEADACHE POWDER PO) Take by mouth 2 (two) times daily as needed.  ? clonazePAM (KLONOPIN) 0.5 MG tablet Take 1 tablet (0.5 mg total) by mouth at bedtime.  ? diltiazem (CARDIZEM CD) 240 MG 24 hr capsule Take 1 capsule (240 mg total) by mouth daily.  ? fluticasone-salmeterol (ADVAIR DISKUS) 250-50 MCG/ACT AEPB Inhale 1 puff into the lungs daily.  ? gabapentin (NEURONTIN) 300 MG capsule Take 1 capsule (300 mg total) by mouth every evening.  ? naproxen (NAPROSYN) 500 MG tablet TAKE ONE TABLET BY MOUTH TWICE A DAY AS NEEDED  ? pantoprazole (PROTONIX) 40 MG tablet Take 1 tablet (40 mg total) by mouth daily.  ? valsartan (DIOVAN) 80 MG tablet Take 0.5 tablets (40 mg total) by mouth daily.  ? fluticasone (FLONASE) 50 MCG/ACT nasal spray Place 2 sprays into both nostrils daily. (Patient not taking: Reported on 09/13/2021)  ? rosuvastatin (CRESTOR) 20 MG tablet Take 1 tablet (20 mg total) by mouth daily.  ? ?No facility-administered medications prior to visit.  ? ? ?Review of Systems  ?Constitutional:  Positive for fatigue. Negative for appetite  change.  ?Respiratory:  Positive for shortness of breath. Negative for chest tightness.   ?Cardiovascular:  Positive for chest pain. Negative for palpitations and leg swelling.  ?Neurological:  Positive for dizziness.  ? ?  ?  Objective  ?  ?BP 118/78 (BP Location: Left Arm, Patient Position: Sitting, Cuff Size: Large)   Pulse 95   Temp 98.6 ?F (37 ?C) (Oral)   Resp 16   Wt 153 lb 6.4 oz (69.6 kg)   SpO2 96%   BMI 23.32 kg/m?  ?BP Readings from Last 3 Encounters:  ?10/15/21 118/78  ?09/13/21 106/76  ?06/15/21 132/82   ? ?Wt Readings from Last 3 Encounters:  ?10/15/21 153 lb 6.4 oz (69.6 kg)  ?09/13/21 159 lb (72.1 kg)  ?06/15/21 166 lb 6.4 oz (75.5 kg)  ? ?  ? ?Physical Exam  ?General appearance: Well developed, well nourished male, cooperative and in no acute distress ?Head: Normocephalic, without obvious abnormality, atraumatic ?Respiratory: Respirations even and unlabored, normal respiratory rate ?Extremities: All extremities are intact.  ?Skin: Skin color, texture, turgor normal. No rashes seen  ?Psych: Appropriate mood and affect. ?Neurologic: Mental status: Alert, oriented to person, place, and time, thought content appropriate.  ? Assessment & Plan  ?  ? ?1. Primary hypertension ?Still having mild dizziness c/w orthostasis. Labs done this week were normal. Change valsartan to  lisinopril (ZESTRIL) 5 MG tablet; Take 1 tablet (5 mg total) by mouth daily.  Dispense: 90 tablet; Refill: 3 ?Continue current dose of diltiazem.  ? ?2. Dizziness ? ? ?3. Primary osteoarthritis of both hands ?refill ?- naproxen (NAPROSYN) 500 MG tablet; Take 1 tablet (500 mg total) by mouth 2 (two) times daily as needed.  Dispense: 60 tablet; Refill: 3  ?   ? ?The entirety of the information documented in the History of Present Illness, Review of Systems and Physical Exam were personally obtained by me. Portions of this information were initially documented by the CMA and reviewed by me for thoroughness and accuracy.   ? ? ?Lelon Huh, MD  ?[facility] ?[phone] (phone) ?[phone] (fax) ? ?[facility]  ?

## 2021-10-16 ENCOUNTER — Other Ambulatory Visit: Payer: Self-pay | Admitting: Family Medicine

## 2021-10-16 DIAGNOSIS — I10 Essential (primary) hypertension: Secondary | ICD-10-CM

## 2021-10-16 DIAGNOSIS — R519 Headache, unspecified: Secondary | ICD-10-CM

## 2021-12-19 ENCOUNTER — Other Ambulatory Visit: Payer: Self-pay | Admitting: Family Medicine

## 2021-12-19 DIAGNOSIS — K219 Gastro-esophageal reflux disease without esophagitis: Secondary | ICD-10-CM

## 2022-01-10 ENCOUNTER — Ambulatory Visit: Payer: Self-pay

## 2022-01-10 NOTE — Telephone Encounter (Signed)
Summary: dizziness and bp concern   The patient has began to experience bouts of dizziness while they're at work for the past few weeks   The patient is also concerned with their BP entering the 150s and 160s which is abnormal for them   BP was 158/95 last time checked approx 10 AM 01/09/22   Please contact the patient further when possible

## 2022-01-10 NOTE — Telephone Encounter (Signed)
  Chief Complaint: dizziness  Symptoms: HTN and dizziness with changing positions  Frequency: ongoing for several weeks  Pertinent Negatives: Patient denies HA or blurred vision Disposition: [] ED /[] Urgent Care (no appt availability in office) / [x] Appointment(In office/virtual)/ []  [facility]/ [] Home Care/ [] Refused Recommended Disposition /[] [facility]/ []  Follow-up with PCP Additional Notes: pt didn't check BP this morning but did take BP meds. Pt states sometimes BP will be low and then itll be high but dizziness occurs frquently and worse with changing positions or looking up. Attempted to schedule for 01/12/22 but pt unable to come in that day so scheduled appt for 01/13/22 at 1000 with Dr. .   Reason for Disposition  [1] Taking BP medications AND [2] feels is having side effects (e.g., impotence, cough, dizzy upon standing)  Answer Assessment - Initial Assessment Questions 1. BLOOD PRESSURE: "What is the blood pressure?" "Did you take at least two measurements 5 minutes apart?"     158/95 2. ONSET: "When did you take your blood pressure?"     Yesterday morning  4. HISTORY: "Do you have a history of high blood pressure?"     yes 5. MEDICINES: "Are you taking any medicines for blood pressure?" "Have you missed any doses recently?"     Yes and not missed any  6. OTHER SYMPTOMS: "Do you have any symptoms?" (e.g., blurred vision, chest pain, difficulty breathing, headache, weakness)     Dizziness and worse with changing positions.  Protocols used: Blood Pressure - High-A-AH

## 2022-01-11 NOTE — Telephone Encounter (Signed)
Pt. Has continued to check his BP. Continues to "go up and down." Today before taking his medication BP was 126/77. After taking medication 93/63. "Then by the evening it's back up to 120-130/70's. Declines appointment today - at work. Has appointment 01/13/22. Instructed to go to ED for worsening of symptoms. Please advise pt.

## 2022-01-11 NOTE — Telephone Encounter (Signed)
Noted we can discuss at appt

## 2022-01-13 ENCOUNTER — Encounter: Payer: Self-pay | Admitting: Physician Assistant

## 2022-01-13 ENCOUNTER — Ambulatory Visit: Payer: BC Managed Care – PPO | Admitting: Physician Assistant

## 2022-01-13 VITALS — BP 137/87 | HR 62 | Wt 155.9 lb

## 2022-01-13 DIAGNOSIS — E785 Hyperlipidemia, unspecified: Secondary | ICD-10-CM

## 2022-01-13 DIAGNOSIS — R200 Anesthesia of skin: Secondary | ICD-10-CM | POA: Diagnosis not present

## 2022-01-13 DIAGNOSIS — R42 Dizziness and giddiness: Secondary | ICD-10-CM | POA: Diagnosis not present

## 2022-01-13 DIAGNOSIS — I10 Essential (primary) hypertension: Secondary | ICD-10-CM

## 2022-01-13 NOTE — Assessment & Plan Note (Addendum)
ddx orthostatic hypotension, vertigo, TIAs  ekg today normal sinus Ordered carotid ultrasound d/t history of atherosclerosis , no bruits heard Pt is on statin Fu 2-3 weeks if no changes and BP is stable, not hypotensive consider ref cardio vs neuro

## 2022-01-13 NOTE — Assessment & Plan Note (Signed)
Consistent w/ carpal tunnel, long standing ref to ortho per pt request

## 2022-01-13 NOTE — Progress Notes (Signed)
   I,[name],acting as a scribe for Lindsay Drubel, PA-C.,have documented all relevant documentation on the behalf of Lindsay Drubel, PA-C,as directed by  Lindsay Drubel, PA-C while in the presence of Lindsay Drubel, PA-C.   Established patient visit   Patient: Justin Olsen   DOB: 05/11/1966   56 y.o. Male  MRN: [id number] Visit Date: 01/13/2022  Today's healthcare provider: Lindsay Drubel, PA-C   Cc. dizziness  Subjective    HPI   Pt reports for the last 3-4 weeks having repeated episodes of dizziness. Primarily when going from sitting/squatting to standing, lying down to sitting up. Describes the dizziness as an unsteadiness with tunnel vision, 'instead of darkness I see brighter light'. Denies syncope. Denies associated chest pain, sob, headache, numbness.  He has been sporadically checking his blood pressure, and has noticed some lower numbers than usual but this is not always associated with the dizziness. Only change has been changing valsartan to lisinopril 5 mg.  Hypertension, follow-up  BP Readings from Last 3 Encounters:  01/13/22 137/87  10/15/21 118/78  09/13/21 106/76   Wt Readings from Last 3 Encounters:  01/13/22 155 lb 14.4 oz (70.7 kg)  10/15/21 153 lb 6.4 oz (69.6 kg)  09/13/21 159 lb (72.1 kg)     He was last seen for hypertension 3 months ago.  BP at that visit was 118/78. Management since that visit includes change valsartan to lisinopril (ZESTRIL) 5 MG tablet.  He reports excellent compliance with treatment. He is not having side effects.  He is following a Regular diet. He is exercising. He does not smoke.  Use of agents associated with hypertension: none.   Outside blood pressures are 87-92/60-63, this morning 136/80, last night 122/75 Symptoms: Patient reports dizziness and  Yes chest pain No chest pressure  No palpitations Yes syncope  No dyspnea No orthopnea  No paroxysmal nocturnal dyspnea No lower extremity edema    Pertinent labs Lab Results  Component Value Date   CHOL 140 07/06/2021   HDL 37 (L) 07/06/2021   LDLCALC 31 07/06/2021   TRIG 361 (H) 07/06/2021   CHOLHDL 3.8 07/06/2021   Lab Results  Component Value Date   NA 139 09/13/2021   K 4.1 09/13/2021   CREATININE 1.27 09/13/2021   EGFR 68 09/13/2021   GLUCOSE 98 09/13/2021     The 10-year ASCVD risk score (Arnett DK, et al., 2019) is: 11%  ---------------------------------------------------------------------------------------------------   Medications: Outpatient Medications Prior to Visit  Medication Sig   albuterol (VENTOLIN HFA) 108 (90 Base) MCG/ACT inhaler INHALE TWO PUFFS BY MOUTH EVERY 6 HOURS AS NEEDED FOR WHEEZING OR FOR SHORTNESS OF BREATH   Aspirin-Salicylamide-Caffeine (BC HEADACHE POWDER PO) Take by mouth 2 (two) times daily as needed.   clonazePAM (KLONOPIN) 0.5 MG tablet Take 1 tablet (0.5 mg total) by mouth at bedtime.   diltiazem (CARDIZEM CD) 240 MG 24 hr capsule TAKE ONE CAPSULE BY MOUTH DAILY   fluticasone (FLONASE) 50 MCG/ACT nasal spray Place 2 sprays into both nostrils daily.   fluticasone-salmeterol (ADVAIR DISKUS) 250-50 MCG/ACT AEPB Inhale 1 puff into the lungs daily.   gabapentin (NEURONTIN) 300 MG capsule Take 1 capsule (300 mg total) by mouth every evening.   lisinopril (ZESTRIL) 5 MG tablet Take 1 tablet (5 mg total) by mouth daily.   naproxen (NAPROSYN) 500 MG tablet Take 1 tablet (500 mg total) by mouth 2 (two) times daily as needed.   pantoprazole (PROTONIX) 40 MG tablet TAKE ONE TABLET BY MOUTH   DAILY   rosuvastatin (CRESTOR) 20 MG tablet Take 1 tablet (20 mg total) by mouth daily.   No facility-administered medications prior to visit.    Review of Systems  Constitutional:  Negative for fatigue and fever.  Respiratory:  Negative for cough and shortness of breath.   Cardiovascular:  Negative for chest pain, palpitations and leg swelling.  Neurological:  Positive for dizziness. Negative for  headaches.      Objective    Blood pressure 137/87, pulse 62, weight 155 lb 14.4 oz (70.7 kg), SpO2 98 %.   Physical Exam Constitutional:      General: He is awake.     Appearance: He is well-developed.  HENT:     Head: Normocephalic.  Eyes:     Conjunctiva/sclera: Conjunctivae normal.  Neck:     Vascular: No carotid bruit.  Cardiovascular:     Rate and Rhythm: Normal rate and regular rhythm.     Heart sounds: Normal heart sounds.  Pulmonary:     Effort: Pulmonary effort is normal.     Breath sounds: Normal breath sounds.  Skin:    General: Skin is warm.  Neurological:     Mental Status: He is alert and oriented to person, place, and time.  Psychiatric:        Attention and Perception: Attention normal.        Mood and Affect: Mood normal.        Speech: Speech normal.        Behavior: Behavior is cooperative.      No results found for any visits on 01/13/22.  Assessment & Plan     Problem List Items Addressed This Visit       Cardiovascular and Mediastinum   Primary hypertension    Advised pt to d/c lisinopril for the next 2-3 weeks to see if this makes a difference. Advised increasing fluids. Orthostatic vitals in office not suggestive of orthostatic hypotension now -- but pt is not symptomatic at visit  Advised pt to keep monitoring bp at home       Relevant Orders   Comprehensive Metabolic Panel (CMET)     Other   Dizziness - Primary    ddx orthostatic hypotension, vertigo, TIAs  ekg today normal sinus Ordered carotid ultrasound d/t history of atherosclerosis , no bruits heard Pt is on statin Fu 2-3 weeks if no changes and BP is stable, not hypotensive consider ref cardio vs neuro      Relevant Orders   EKG 12-Lead   Comprehensive Metabolic Panel (CMET)   HgB A1c   US Carotid Bilateral   Bilateral hand numbness    Consistent w/ carpal tunnel, long standing ref to ortho per pt request      Relevant Orders   AMB referral to orthopedics    Other Visit Diagnoses     Hyperlipidemia, unspecified hyperlipidemia type       Relevant Orders   US Carotid Bilateral        Return in about 3 weeks (around 02/03/2022) for dizziness.      I, Lindsay Drubel, PA-C have reviewed all documentation for this visit. The documentation on  01/13/2022  for the exam, diagnosis, procedures, and orders are all accurate and complete.  Lindsay Drubel, PA-C [facility] [address] [place], [place], [address] Office: [phone] Fax: [phone]   [facility]  

## 2022-01-13 NOTE — Assessment & Plan Note (Signed)
Advised pt to d/c lisinopril for the next 2-3 weeks to see if this makes a difference. Advised increasing fluids. Orthostatic vitals in office not suggestive of orthostatic hypotension now -- but pt is not symptomatic at visit  Advised pt to keep monitoring bp at home

## 2022-01-14 LAB — COMPREHENSIVE METABOLIC PANEL
ALT: 14 IU/L (ref 0–44)
AST: 14 IU/L (ref 0–40)
Albumin/Globulin Ratio: 2.1 (ref 1.2–2.2)
Albumin: 4.8 g/dL (ref 3.8–4.9)
Alkaline Phosphatase: 68 IU/L (ref 44–121)
BUN/Creatinine Ratio: 16 (ref 9–20)
BUN: 17 mg/dL (ref 6–24)
Bilirubin Total: 0.3 mg/dL (ref 0.0–1.2)
CO2: 23 mmol/L (ref 20–29)
Calcium: 9.9 mg/dL (ref 8.7–10.2)
Chloride: 105 mmol/L (ref 96–106)
Creatinine, Ser: 1.05 mg/dL (ref 0.76–1.27)
Globulin, Total: 2.3 g/dL (ref 1.5–4.5)
Glucose: 80 mg/dL (ref 70–99)
Potassium: 4.9 mmol/L (ref 3.5–5.2)
Sodium: 142 mmol/L (ref 134–144)
Total Protein: 7.1 g/dL (ref 6.0–8.5)
eGFR: 84 mL/min/{1.73_m2} (ref >59)

## 2022-01-14 LAB — HEMOGLOBIN A1C
Est. average glucose Bld gHb Est-mCnc: 117 mg/dL
Hgb A1c MFr Bld: 5.7 % — ABNORMAL HIGH (ref 4.8–5.6)

## 2022-01-19 ENCOUNTER — Ambulatory Visit
Admission: RE | Admit: 2022-01-19 | Discharge: 2022-01-19 | Disposition: A | Discharge status: Home / Self Care | Payer: BC Managed Care – PPO | Source: Ambulatory Visit | Attending: Physician Assistant | Admitting: Physician Assistant

## 2022-01-19 DIAGNOSIS — E785 Hyperlipidemia, unspecified: Secondary | ICD-10-CM | POA: Insufficient documentation

## 2022-01-19 DIAGNOSIS — I6523 Occlusion and stenosis of bilateral carotid arteries: Secondary | ICD-10-CM | POA: Diagnosis not present

## 2022-01-19 DIAGNOSIS — R42 Dizziness and giddiness: Secondary | ICD-10-CM | POA: Diagnosis not present

## 2022-01-21 ENCOUNTER — Telehealth: Payer: Self-pay | Admitting: Family Medicine

## 2022-01-21 DIAGNOSIS — I10 Essential (primary) hypertension: Secondary | ICD-10-CM

## 2022-01-21 DIAGNOSIS — R519 Headache, unspecified: Secondary | ICD-10-CM

## 2022-01-21 MED ORDER — DILTIAZEM HCL ER COATED BEADS 240 MG PO CP24: 240.0000 mg | ORAL_CAPSULE | Freq: Every day | ORAL | 3 refills | Status: DC

## 2022-01-21 NOTE — Telephone Encounter (Signed)
[facility] faxed refill request for the following medications:   diltiazem (CARDIZEM CD) 240 MG 24 hr capsule    Please advise.  

## 2022-01-26 ENCOUNTER — Other Ambulatory Visit: Payer: Self-pay | Admitting: Cardiovascular Disease

## 2022-01-26 DIAGNOSIS — E7800 Pure hypercholesterolemia, unspecified: Secondary | ICD-10-CM

## 2022-01-26 NOTE — Telephone Encounter (Signed)
Please contact pt for future appointment. Pt needing refills. 

## 2022-01-27 NOTE — Progress Notes (Signed)
Established patient visit  I,Justin Olsen,acting as a scribe for [employer], PA-C.,have documented all relevant documentation on the behalf of Justin Ferguson, PA-C,as directed by  Justin Ferguson, PA-C while in the presence of Justin Ferguson, PA-C.   Patient: Justin Olsen   DOB: [birth year]   56 y.o. Male  MRN: [id number] Visit Date: 01/28/2022  Today's healthcare provider: [name], PA-C   Chief Complaint  Patient presents with   Follow-up Dizziness   Subjective    HPI  Follow up for dizziness  The patient was last seen for this 2-3 weeks ago. Changes made at last visit include d/c lisinopril for the next 2-3 weeks to see if this makes a difference. Advised increasing fluids. Fu 2-3 weeks if no changes and BP is stable, not hypotensive consider ref cardio vs neuro  Reports that has been having a fewer spells than before, and now really only when kneeling or squatting for a long time and standing up quickly   BP at home before d/c lisinop ril was 91/63, now average is 150s-160/90s.  Outpatient Medications Prior to Visit  Medication Sig   albuterol (VENTOLIN HFA) 108 (90 Base) MCG/ACT inhaler INHALE TWO PUFFS BY MOUTH EVERY 6 HOURS AS NEEDED FOR WHEEZING OR FOR SHORTNESS OF BREATH   Aspirin-Salicylamide-Caffeine (BC HEADACHE POWDER PO) Take by mouth 2 (two) times daily as needed.   clonazePAM (KLONOPIN) 0.5 MG tablet Take 1 tablet (0.5 mg total) by mouth at bedtime.   diltiazem (CARDIZEM CD) 240 MG 24 hr capsule Take 1 capsule (240 mg total) by mouth daily.   fluticasone-salmeterol (ADVAIR DISKUS) 250-50 MCG/ACT AEPB Inhale 1 puff into the lungs daily.   gabapentin (NEURONTIN) 300 MG capsule Take 1 capsule (300 mg total) by mouth every evening.   lisinopril (ZESTRIL) 5 MG tablet Take 1 tablet (5 mg total) by mouth daily.   naproxen (NAPROSYN) 500 MG tablet Take 1 tablet (500 mg total) by mouth 2 (two) times daily as needed.   pantoprazole (PROTONIX) 40 MG  tablet TAKE ONE TABLET BY MOUTH DAILY   rosuvastatin (CRESTOR) 20 MG tablet TAKE ONE TABLET BY MOUTH DAILY   fluticasone (FLONASE) 50 MCG/ACT nasal spray Place 2 sprays into both nostrils daily.   No facility-administered medications prior to visit.    Review of Systems  Eyes:  Negative for visual disturbance.  Respiratory:  Negative for chest tightness and shortness of breath.   Cardiovascular:  Negative for chest pain, palpitations and leg swelling.  Neurological:  Positive for dizziness and light-headedness.       Objective    BP (!) 161/105 (BP Location: Right Arm, Patient Position: Sitting, Cuff Size: Normal)   Pulse 67   Temp 98 F (36.7 C) (Oral)   Resp 16   Wt 156 lb 6.4 oz (70.9 kg)   SpO2 99%   BMI 23.78 kg/m   Physical Exam Constitutional:      General: He is awake.     Appearance: He is well-developed.  HENT:     Head: Normocephalic.  Eyes:     Conjunctiva/sclera: Conjunctivae normal.  Cardiovascular:     Rate and Rhythm: Normal rate and regular rhythm.     Heart sounds: Normal heart sounds.  Pulmonary:     Effort: Pulmonary effort is normal.     Breath sounds: Normal breath sounds.  Skin:    General: Skin is warm.  Neurological:     Mental Status: He is alert and oriented  to person, place, and time.  Psychiatric:        Attention and Perception: Attention normal.        Mood and Affect: Mood normal.        Speech: Speech normal.        Behavior: Behavior is cooperative.      No results found for any visits on 01/28/22.  Assessment & Plan     Problem List Items Addressed This Visit       Cardiovascular and Mediastinum   Primary hypertension - Primary    Now HTN again with d/c of lisinopril Pt feels better and is hesitant to start another bp med, but advised his pressure is now too high Since he had orthostatic symptoms with valsartan, now lisinopril, we can try hctz 12.5 mg.  Advised to f/u with pcp in 4 weeks, pt also has upcoming appt  with cardiology advised he bring this up with them as awell.       Relevant Medications   hydrochlorothiazide (MICROZIDE) 12.5 MG capsule     Other   Dizziness    Improved with higher pressures. See htn note Carotid US was negative.        Return in about 4 weeks (around 02/25/2022) for hypertension, w/ pcp.      I, Justin Ferguson, PA-C have reviewed all documentation for this visit. The documentation on  01/28/2022  for the exam, diagnosis, procedures, and orders are all accurate and complete.Justin Ferguson, PA-C [facility] [address] [place], [place], [address] Office: [phone] Fax: [phone]   [facility]

## 2022-01-28 ENCOUNTER — Encounter: Payer: Self-pay | Admitting: Physician Assistant

## 2022-01-28 ENCOUNTER — Ambulatory Visit (INDEPENDENT_AMBULATORY_CARE_PROVIDER_SITE_OTHER): Payer: BC Managed Care – PPO | Admitting: Physician Assistant

## 2022-01-28 VITALS — BP 161/105 | HR 67 | Temp 98.0°F | Resp 16 | Wt 156.4 lb

## 2022-01-28 DIAGNOSIS — G5603 Carpal tunnel syndrome, bilateral upper limbs: Secondary | ICD-10-CM | POA: Diagnosis not present

## 2022-01-28 DIAGNOSIS — M180 Bilateral primary osteoarthritis of first carpometacarpal joints: Secondary | ICD-10-CM | POA: Diagnosis not present

## 2022-01-28 DIAGNOSIS — R42 Dizziness and giddiness: Secondary | ICD-10-CM

## 2022-01-28 DIAGNOSIS — M79642 Pain in left hand: Secondary | ICD-10-CM | POA: Diagnosis not present

## 2022-01-28 DIAGNOSIS — I10 Essential (primary) hypertension: Secondary | ICD-10-CM

## 2022-01-28 DIAGNOSIS — M79641 Pain in right hand: Secondary | ICD-10-CM | POA: Diagnosis not present

## 2022-01-28 MED ORDER — HYDROCHLOROTHIAZIDE 12.5 MG PO CAPS: 12.5000 mg | ORAL_CAPSULE | Freq: Every day | ORAL | 1 refills | Status: DC

## 2022-01-28 NOTE — Assessment & Plan Note (Signed)
Now HTN again with d/c of lisinopril Pt feels better and is hesitant to start another bp med, but advised his pressure is now too high Since he had orthostatic symptoms with valsartan, now lisinopril, we can try hctz 12.5 mg.  Advised to f/u with pcp in 4 weeks, pt also has upcoming appt with cardiology advised he bring this up with them as awell.

## 2022-01-28 NOTE — Assessment & Plan Note (Signed)
Improved with higher pressures. See htn note Carotid US was negative.

## 2022-02-28 ENCOUNTER — Telehealth: Payer: Self-pay | Admitting: Medical

## 2022-02-28 NOTE — Telephone Encounter (Signed)
Informed patient that next available appt would be on 10/10. Patient insisted on me to see if nurse would be able to get him in earlier on 9/13 due to scheduling conflict with ortho doctor

## 2022-03-02 ENCOUNTER — Ambulatory Visit: Payer: BC Managed Care – PPO | Admitting: Medical

## 2022-03-02 ENCOUNTER — Other Ambulatory Visit: Payer: Self-pay | Admitting: Family Medicine

## 2022-03-02 DIAGNOSIS — M19042 Primary osteoarthritis, left hand: Secondary | ICD-10-CM

## 2022-03-02 DIAGNOSIS — R200 Anesthesia of skin: Secondary | ICD-10-CM | POA: Diagnosis not present

## 2022-03-06 NOTE — Progress Notes (Unsigned)
Cardiology Office Note:    Date:  03/07/2022   ID:  Justin Olsen, DOB 1966/01/11, MRN [id number]  PCP:  Justin Limes, MD  [facility] Cardiologist:  Justin Nordmann, MD  [facility] Electrophysiologist:  None   Referring MD: Justin Limes, MD   Chief Complaint: 1 year Follow-up  History of Present Illness:    Justin Olsen is a 56 y.o. male with a hx of COPD, smoker, CAD, HTN, h/o orthostatic symptoms who presents for 1 year follow-up.   He was first seen as a new patient 07/2020 for elevated BP and chest pain. Prior ER work up for chest pain was negative. He was continued on diltiazem 120mg  daily. Coronary calcium score was ordered showing 23.6, 68th percentile for age and sex matched, CAC 1-99 in LAD, CAC-DRS A1/N1.   Today, patient is overall doing well. BP is high today. He says he had a stressful phone call before the appointment and is a little agitated, which may be contributing to the BP. BP at home was 143/93. He takes Cardizem and HCTZ at night. He has occasional chest pain that may be GERD. On the right side, while at rest. No shortness of breath. Pain lasted about a minute. Also has some shooting pain. He is smoking 1ppd, not wanting to quit. Patient walks al day at his job. Denies any exertional chest pain. Feels more tired lately. He quit taking ASA due to bleeding.   Past Medical History:  Diagnosis Date   Emphysema lung (HCC)     History reviewed. No pertinent surgical history.  Current Medications: Current Meds  Medication Sig   albuterol (VENTOLIN HFA) 108 (90 Base) MCG/ACT inhaler INHALE TWO PUFFS BY MOUTH EVERY 6 HOURS AS NEEDED FOR WHEEZING OR FOR SHORTNESS OF BREATH   Aspirin-Salicylamide-Caffeine (BC HEADACHE POWDER PO) Take by mouth 2 (two) times daily as needed.   clonazePAM (KLONOPIN) 0.5 MG tablet Take 1 tablet (0.5 mg total) by mouth at bedtime.   diltiazem (CARDIZEM CD) 240 MG 24 hr capsule Take 1 capsule (240 mg total) by mouth daily.    fluticasone-salmeterol (ADVAIR DISKUS) 250-50 MCG/ACT AEPB Inhale 1 puff into the lungs daily.   gabapentin (NEURONTIN) 300 MG capsule Take 1 capsule (300 mg total) by mouth every evening.   hydrochlorothiazide (MICROZIDE) 12.5 MG capsule Take 1 capsule (12.5 mg total) by mouth daily.   naproxen (NAPROSYN) 500 MG tablet TAKE ONE TABLET BY MOUTH TWICE A DAY AS NEEDED   pantoprazole (PROTONIX) 40 MG tablet TAKE ONE TABLET BY MOUTH DAILY   rosuvastatin (CRESTOR) 20 MG tablet TAKE ONE TABLET BY MOUTH DAILY     Allergies:   Patient has no known allergies.   Social History   Socioeconomic History   Marital status: Married    Spouse name: Not on file   Number of children: Not on file   Years of education: Not on file   Highest education level: Not on file  Occupational History   Not on file  Tobacco Use   Smoking status: Every Day    Packs/day: 1.00    Years: 30.00    Total pack years: 30.00    Types: Cigarettes   Smokeless tobacco: Never  Vaping Use   Vaping Use: Never used  Substance and Sexual Activity   Alcohol use: Yes    Alcohol/week: 4.0 standard drinks of alcohol    Types: 4 Cans of beer per week    Comment: 4 beer once a week  Drug use: Not Currently   Sexual activity: Not on file  Other Topics Concern   Not on file  Social History Narrative   Not on file   Social Determinants of Health   Financial Resource Strain: Not on file  Food Insecurity: Not on file  Transportation Needs: Not on file  Physical Activity: Not on file  Stress: Not on file  Social Connections: Not on file     Family History: The patient's family history includes Cancer in his maternal grandfather and paternal grandfather; Diabetes in his father and mother; Heart disease in his father; Stroke in his mother.  ROS:   Please see the history of present illness.     All other systems reviewed and are negative.  EKGs/Labs/Other Studies Reviewed:    The following studies were reviewed  today:  Cardiac calcium scoring IMPRESSION AND RECOMMENDATION: 1. Coronary calcium score of 23.6. This was 68th percentile for age and sex matched control.   2. CAC 1-99 in LAD. CAC-DRS A1/N1.   3. Continue heart healthy lifestyle and risk factor modification.   Justin Olsen     Electronically Signed   By: Justin Olsen M.D.   On: 01/04/2021 19:16    EKG:  EKG is ordered today.  The ekg ordered today demonstrates NSR 68bpm, LVH, nonspecific T wave changes  Recent Labs: 09/13/2021: Hemoglobin 14.4; Platelets 210 01/13/2022: ALT 14; BUN 17; Creatinine, Ser 1.05; Potassium 4.9; Sodium 142  Recent Lipid Panel    Component Value Date/Time   CHOL 140 07/06/2021 1622   TRIG 361 (H) 07/06/2021 1622   HDL 37 (L) 07/06/2021 1622   CHOLHDL 3.8 07/06/2021 1622   VLDL 72 (H) 07/06/2021 1622   LDLCALC 31 07/06/2021 1622    Physical Exam:    VS:  BP (!) 160/90 (BP Location: Left Arm, Patient Position: Sitting, Cuff Size: Normal)   Pulse 68   Ht 5\' 8"  (1.727 m)   Wt 156 lb (70.8 kg)   SpO2 98%   BMI 23.72 kg/m     Wt Readings from Last 3 Encounters:  03/07/22 156 lb (70.8 kg)  01/28/22 156 lb 6.4 oz (70.9 kg)  01/13/22 155 lb 14.4 oz (70.7 kg)     GEN:  Well nourished, well developed in no acute distress HEENT: Normal NECK: No JVD; No carotid bruits LYMPHATICS: No lymphadenopathy CARDIAC: RRR, no murmurs, rubs, gallops RESPIRATORY:  Clear to auscultation without rales, wheezing or rhonchi  ABDOMEN: Soft, non-tender, non-distended MUSCULOSKELETAL:  No edema; No deformity  SKIN: Warm and dry NEUROLOGIC:  Alert and oriented x 3 PSYCHIATRIC:  Normal affect   ASSESSMENT:    1. Primary hypertension   2. Coronary artery disease involving native coronary artery of native heart with other form of angina pectoris ([place])   3. Smoker   4. Other fatigue    PLAN:    In order of problems listed above:  HTN BP elevated today 160/90, he said at home it's generally  140s/90s. He is taking Cardizem 240mg  in the AM and HCTZ 12.5mg  daily at night. He had hypotension/orthostasis with Lisinopril. PCP is currently following BP closely. I offered to make changes, but he has appointment with her later this week, so I will defer further changes to PCP.   Atypical chest pain CAD The patient reports occasional atypical chest pain, not felt to be related to the heart. He thinks it may be stress-related. Prior calcium score in 2022 was 3. 68th percentile for age and sex  matched control. We discussed stress test, but we agreed to wait at this time. He will start to take ASA 81mg  daily. Continue Crestor 20mg  daily. If chest pain were to worsen or change, evaluation was recommended.   Tobacco use He is smoking 1ppd, does not want to quit.   Fatigue I will check a CBC.   Disposition: Follow up in 6 month(s) with MD/APP    Signed, Raylee Strehl , PA-C  03/07/2022 9:15 AM    [facility]

## 2022-03-07 ENCOUNTER — Other Ambulatory Visit
Admission: RE | Admit: 2022-03-07 | Discharge: 2022-03-07 | Disposition: A | Discharge status: Home / Self Care | Payer: BC Managed Care – PPO | Attending: Medical | Admitting: Medical

## 2022-03-07 ENCOUNTER — Encounter: Payer: Self-pay | Admitting: Medical

## 2022-03-07 ENCOUNTER — Ambulatory Visit: Payer: BC Managed Care – PPO | Attending: Medical | Admitting: Medical

## 2022-03-07 VITALS — BP 160/90 | HR 68 | Ht 68.0 in | Wt 156.0 lb

## 2022-03-07 DIAGNOSIS — I10 Essential (primary) hypertension: Secondary | ICD-10-CM | POA: Insufficient documentation

## 2022-03-07 DIAGNOSIS — I25118 Atherosclerotic heart disease of native coronary artery with other forms of angina pectoris: Secondary | ICD-10-CM

## 2022-03-07 DIAGNOSIS — F17200 Nicotine dependence, unspecified, uncomplicated: Secondary | ICD-10-CM | POA: Diagnosis not present

## 2022-03-07 DIAGNOSIS — R5383 Other fatigue: Secondary | ICD-10-CM

## 2022-03-07 LAB — CBC WITH DIFFERENTIAL/PLATELET
Abs Immature Granulocytes: 0.04 10*3/uL (ref 0.00–0.07)
Basophils Absolute: 0.1 10*3/uL (ref 0.0–0.1)
Basophils Relative: 1 %
Eosinophils Absolute: 0.8 10*3/uL — ABNORMAL HIGH (ref 0.0–0.5)
Eosinophils Relative: 6 %
HCT: 42.4 % (ref 39.0–52.0)
Hemoglobin: 15.2 g/dL (ref 13.0–17.0)
Immature Granulocytes: 0 %
Lymphocytes Relative: 28 %
Lymphs Abs: 3.3 10*3/uL (ref 0.7–4.0)
MCH: 32.1 pg (ref 26.0–34.0)
MCHC: 35.8 g/dL (ref 30.0–36.0)
MCV: 89.5 fL (ref 80.0–100.0)
Monocytes Absolute: 0.9 10*3/uL (ref 0.1–1.0)
Monocytes Relative: 7 %
Neutro Abs: 6.9 10*3/uL (ref 1.7–7.7)
Neutrophils Relative %: 58 %
Platelets: 222 10*3/uL (ref 150–400)
RBC: 4.74 MIL/uL (ref 4.22–5.81)
RDW: 13.2 % (ref 11.5–15.5)
WBC: 12 10*3/uL — ABNORMAL HIGH (ref 4.0–10.5)
nRBC: 0 % (ref 0.0–0.2)

## 2022-03-07 NOTE — Patient Instructions (Signed)
Medication Instructions:  Your physician recommends that you continue on your current medications as directed. Please refer to the Current Medication list given to you today.  *If you need a refill on your cardiac medications before your next appointment, please call your pharmacy*   Lab Work: Cbc today. Please have your lab drawn at the [facility]. Stop at the Registration desk to check in.  If you have labs (blood work) drawn today and your tests are completely normal, you will receive your results only by: [place] (if you have MyChart) OR A paper copy in the mail If you have any lab test that is abnormal or we need to change your treatment, we will call you to review the results.   Testing/Procedures: None ordered   Follow-Up: At [facility], you and your health needs are our priority.  As part of our continuing mission to provide you with exceptional heart care, we have created designated Provider Care Teams.  These Care Teams include your primary Cardiologist (physician) and Advanced Practice Providers (APPs -  Physician Assistants and Nurse Practitioners) who all work together to provide you with the care you need, when you need it.  We recommend signing up for the patient portal called "MyChart".  Sign up information is provided on this After Visit Summary.  MyChart is used to connect with patients for Virtual Visits (Telemedicine).  Patients are able to view lab/test results, encounter notes, upcoming appointments, etc.  Non-urgent messages can be sent to your provider as well.   To learn more about what you can do with MyChart, go to [email or web address].    Your next appointment:   6 month(s)  The format for your next appointment:   In Person  Provider:   You may see Ida Rogue, MD or one of the following Advanced Practice Providers on your designated Care Team:   Murray Hodgkins, NP Christell Faith, PA-C Cadence Kathlen Mody, PA-C Gerrie Nordmann, NP    Other Instructions N/A  Important Information About Sugar

## 2022-03-11 ENCOUNTER — Encounter: Payer: Self-pay | Admitting: Family Medicine

## 2022-03-11 ENCOUNTER — Ambulatory Visit (INDEPENDENT_AMBULATORY_CARE_PROVIDER_SITE_OTHER): Payer: BC Managed Care – PPO | Admitting: Family Medicine

## 2022-03-11 VITALS — BP 143/98 | HR 79 | Temp 98.3°F | Resp 20 | Wt 158.0 lb

## 2022-03-11 DIAGNOSIS — G2581 Restless legs syndrome: Secondary | ICD-10-CM

## 2022-03-11 DIAGNOSIS — I10 Essential (primary) hypertension: Secondary | ICD-10-CM

## 2022-03-11 DIAGNOSIS — F5101 Primary insomnia: Secondary | ICD-10-CM | POA: Diagnosis not present

## 2022-03-11 MED ORDER — CLONAZEPAM 0.5 MG PO TABS: 0.5000 mg | ORAL_TABLET | Freq: Every day | ORAL | 1 refills | Status: DC

## 2022-03-11 NOTE — Progress Notes (Signed)
I,Roshena L Chambers,acting as a scribe for Lelon Huh, MD.,have documented all relevant documentation on the behalf of Lelon Huh, MD,as directed by  Lelon Huh, MD while in the presence of Lelon Huh, MD.    Established patient visit   Patient: Justin Olsen   DOB: 07-28-1965   56 y.o. Male  MRN: [id number] Visit Date: 03/11/2022  Today's healthcare provider: Lelon Huh, MD   Chief Complaint  Patient presents with   Hypertension   Subjective    HPI  Hypertension, follow-up  BP Readings from Last 3 Encounters:  03/11/22 (!) 143/98  03/07/22 (!) 160/90  01/28/22 (!) 161/105   Wt Readings from Last 3 Encounters:  03/11/22 158 lb (71.7 kg)  03/07/22 156 lb (70.8 kg)  01/28/22 156 lb 6.4 oz (70.9 kg)     He was last seen for hypertension on 01/28/2022.   BP at that visit was 161/105. Management since that visit includes changing lisinopril to HCTZ 12.65m daily due to dizziness. He tolerated this much better than the lisinopril   He reports good compliance with treatment. He is not having side effects.  He is following a Regular diet. He is not exercising. He does smoke.  Use of agents associated with hypertension: none.   Outside blood pressures are checked and average 1409-811systolic. Patient does not remember to diastolic readings. Symptoms: Yes chest pain No chest pressure  No palpitations No syncope  No dyspnea No orthopnea  No paroxysmal nocturnal dyspnea No lower extremity edema   Pertinent labs Lab Results  Component Value Date   CHOL 140 07/06/2021   HDL 37 (L) 07/06/2021   LDLCALC 31 07/06/2021   TRIG 361 (H) 07/06/2021   CHOLHDL 3.8 07/06/2021   Lab Results  Component Value Date   NA 142 01/13/2022   K 4.9 01/13/2022   CREATININE 1.05 01/13/2022   EGFR 84 01/13/2022   GLUCOSE 80 01/13/2022     The 10-year ASCVD risk score (Arnett DK, et al., 2019) is:  11.9%  ---------------------------------------------------------------------------------------------------   Medications: Outpatient Medications Prior to Visit  Medication Sig   albuterol (VENTOLIN HFA) 108 (90 Base) MCG/ACT inhaler INHALE TWO PUFFS BY MOUTH EVERY 6 HOURS AS NEEDED FOR WHEEZING OR FOR SHORTNESS OF BREATH   Aspirin-Salicylamide-Caffeine (BC HEADACHE POWDER PO) Take by mouth 2 (two) times daily as needed.   clonazePAM (KLONOPIN) 0.5 MG tablet Take 1 tablet (0.5 mg total) by mouth at bedtime.   diltiazem (CARDIZEM CD) 240 MG 24 hr capsule Take 1 capsule (240 mg total) by mouth daily.   fluticasone-salmeterol (ADVAIR DISKUS) 250-50 MCG/ACT AEPB Inhale 1 puff into the lungs daily.   gabapentin (NEURONTIN) 300 MG capsule Take 1 capsule (300 mg total) by mouth every evening.   hydrochlorothiazide (MICROZIDE) 12.5 MG capsule Take 1 capsule (12.5 mg total) by mouth daily.   naproxen (NAPROSYN) 500 MG tablet TAKE ONE TABLET BY MOUTH TWICE A DAY AS NEEDED   pantoprazole (PROTONIX) 40 MG tablet TAKE ONE TABLET BY MOUTH DAILY   rosuvastatin (CRESTOR) 20 MG tablet TAKE ONE TABLET BY MOUTH DAILY   No facility-administered medications prior to visit.    Review of Systems  Constitutional:  Negative for appetite change, chills and fever.  Respiratory:  Negative for chest tightness, shortness of breath and wheezing.   Cardiovascular:  Positive for chest pain (started 1 month ago). Negative for palpitations.  Gastrointestinal:  Negative for abdominal pain, nausea and vomiting.       Objective  BP (!) 143/98 (BP Location: Left Arm, Patient Position: Sitting, Cuff Size: Normal)   Pulse 79   Temp 98.3 F (36.8 C) (Oral)   Resp 20   Wt 158 lb (71.7 kg)   SpO2 99% Comment: room air  BMI 24.02 kg/m    Physical Exam    General: Appearance:    Well developed, well nourished male in no acute distress  Eyes:    PERRL, conjunctiva/corneas clear, EOM's intact       Lungs:     Clear  to auscultation bilaterally, respirations unlabored  Heart:    Normal heart rate. Normal rhythm. No murmurs, rubs, or gallops.    MS:   All extremities are intact.    Neurologic:   Awake, alert, oriented x 3. No apparent focal neurological defect.         Assessment & Plan     1. Primary hypertension Is tolerating hctz much better than lisinopril, but BP not at goal. Will increase to 26m with next feel at the end of of the month. .    2. Restless leg syndrome Doing well with clonazepam.   3. Primary insomnia Is still having trouble staying asleep through the night, but does not want to add any more sedatives to current medications.   Future Appointments  Date Time Provider [place] 08/19/2022  8:00 AM GMinna Merritts MD [facility] None  08/19/2022  2:20 PM Dionysios Massman, DKirstie Peri MD [facility]        The entirety of the information documented in the History of Present Illness, Review of Systems and Physical Exam were personally obtained by me. Portions of this information were initially documented by the CMA and reviewed by me for thoroughness and accuracy.     DLelon Huh MD  [phone](phone) [phone](fax)  [place]

## 2022-03-11 NOTE — Patient Instructions (Signed)
.   Please review the attached list of medications and notify my office if there are any errors.   . Please bring all of your medications to every appointment so we can make sure that our medication list is the same as yours.   

## 2022-03-16 DIAGNOSIS — M79642 Pain in left hand: Secondary | ICD-10-CM | POA: Diagnosis not present

## 2022-03-16 DIAGNOSIS — G5603 Carpal tunnel syndrome, bilateral upper limbs: Secondary | ICD-10-CM | POA: Diagnosis not present

## 2022-03-16 DIAGNOSIS — M180 Bilateral primary osteoarthritis of first carpometacarpal joints: Secondary | ICD-10-CM | POA: Diagnosis not present

## 2022-03-17 ENCOUNTER — Other Ambulatory Visit: Payer: Self-pay | Admitting: Family Medicine

## 2022-03-17 DIAGNOSIS — I10 Essential (primary) hypertension: Secondary | ICD-10-CM

## 2022-03-17 MED ORDER — HYDROCHLOROTHIAZIDE 25 MG PO TABS: 25.0000 mg | ORAL_TABLET | Freq: Every day | ORAL | 1 refills | Status: DC

## 2022-03-28 ENCOUNTER — Other Ambulatory Visit: Payer: Self-pay | Admitting: Cardiovascular Disease

## 2022-03-28 DIAGNOSIS — E7800 Pure hypercholesterolemia, unspecified: Secondary | ICD-10-CM

## 2022-03-31 ENCOUNTER — Other Ambulatory Visit: Payer: Self-pay | Admitting: Family Medicine

## 2022-03-31 DIAGNOSIS — R0600 Dyspnea, unspecified: Secondary | ICD-10-CM

## 2022-03-31 DIAGNOSIS — G2581 Restless legs syndrome: Secondary | ICD-10-CM

## 2022-03-31 DIAGNOSIS — R519 Headache, unspecified: Secondary | ICD-10-CM

## 2022-04-27 ENCOUNTER — Other Ambulatory Visit: Payer: Self-pay | Admitting: Family Medicine

## 2022-04-27 DIAGNOSIS — R0600 Dyspnea, unspecified: Secondary | ICD-10-CM

## 2022-04-27 NOTE — Telephone Encounter (Signed)
Requested Prescriptions  Pending Prescriptions Disp Refills   ADVAIR DISKUS 250-50 MCG/ACT AEPB [Pharmacy Med Name: ADVAIR 250-50 DISKUS] 60 each 2    Sig: INHALE ONE PUFF BY MOUTH DAILY     Pulmonology:  Combination Products Passed - 04/27/2022 12:28 PM      Passed - Valid encounter within last 12 months    Recent Outpatient Visits           1 month ago Primary hypertension   [facility] Malva Limes, MD   2 months ago Primary hypertension   [facility] Edwards, [place], PA-C   3 months ago Dizziness   [facility] [name], PA-C   6 months ago Primary hypertension   [facility] Malva Limes, MD   7 months ago Dizziness   [facility] Malva Limes, MD       Future Appointments             In 3 months Gollan, Tollie Pizza, MD [facility] [place]   In 3 months Fisher, [name], MD [facility], [facility]

## 2022-05-16 ENCOUNTER — Ambulatory Visit: Payer: Self-pay | Admitting: *Deleted

## 2022-05-16 NOTE — Telephone Encounter (Signed)
Summary: cough, ? sinus infection   Sinus infection for 3 weeks, patient experiencing head ache, yellow mucus in the morning and white mucus in the evening and chest congestion. Patient declined an appointment and would prefer PCP to send in a Rx   [facility] [id number] - [place], [place] - [address] Phone: [phone] Fax: [phone]       Chief Complaint: cough congestion requesting medication  Symptoms: sinus headache upper resp issues. Blowing nose for thick yellow mucus in am and white mucus in pm. Cough and congestion . Sx worse in am . Can not get rid of cough even with OTC medications Frequency: 1 month and 1/2 Pertinent Negatives: Patient denies chest pain no difficulty breathing no fever Disposition: [] ED /[] Urgent Care (no appt availability in office) / [] Appointment(In office/virtual)/ []  [place] [facility]/ [] Home Care/ [x] Refused Recommended Disposition /[] [place] [facility]/ [x]  Follow-up with PCP Additional Notes:  Recommended appt with PCP within 3 days. Patient reports he is unable to come to appt. And will be out of touch with phone service  this afternoon please advise      Reason for Disposition  Cough has been present for > 3 weeks  Answer Assessment - Initial Assessment Questions 1. ONSET: "When did the cough begin?"      Approx. 1 month 1/2 ago  2. SEVERITY: "How bad is the cough today?"      Can't get rid of cough 3. SPUTUM: "Describe the color of your sputum" (none, dry cough; clear, white, yellow, green)     Yellow in am and white in pm  4. HEMOPTYSIS: "Are you coughing up any blood?" If so ask: "How much?" (flecks, streaks, tablespoons, etc.)     no 5. DIFFICULTY BREATHING: "Are you having difficulty breathing?" If Yes, ask: "How bad is it?" (e.g., mild, moderate, severe)    - MILD: No SOB at rest, mild SOB with walking, speaks normally in sentences, can lie down, no retractions, pulse < 100.    - MODERATE: SOB at rest, SOB  with minimal exertion and prefers to sit, cannot lie down flat, speaks in phrases, mild retractions, audible wheezing, pulse 100-120.    - SEVERE: Very SOB at rest, speaks in single words, struggling to breathe, sitting hunched forward, retractions, pulse > 120      No issues with breathing  6. FEVER: "Do you have a fever?" If Yes, ask: "What is your temperature, how was it measured, and when did it start?"     No  7. CARDIAC HISTORY: "Do you have any history of heart disease?" (e.g., heart attack, congestive heart failure)      na 8. LUNG HISTORY: "Do you have any history of lung disease?"  (e.g., pulmonary embolus, asthma, emphysema)     Hx smoker  9. PE RISK FACTORS: "Do you have a history of blood clots?" (or: recent major surgery, recent prolonged travel, bedridden)     na 10. OTHER SYMPTOMS: "Do you have any other symptoms?" (e.g., runny nose, wheezing, chest pain)       Runny nose blows out thick yellow mucus am and white in pm. Cough and congestion.  11. PREGNANCY: "Is there any chance you are pregnant?" "When was your last menstrual period?"       na 12. TRAVEL: "Have you traveled out of the country in the last month?" (e.g., travel history, exposures)       na  Protocols used: Cough - Acute Productive-A-AH

## 2022-05-16 NOTE — Telephone Encounter (Signed)
Called patient to schedule appt. LMTCB. Okay for [facility] to advise.  

## 2022-05-17 ENCOUNTER — Encounter: Payer: Self-pay | Admitting: Physician Assistant

## 2022-05-17 ENCOUNTER — Ambulatory Visit: Payer: BC Managed Care – PPO | Admitting: Physician Assistant

## 2022-05-17 ENCOUNTER — Encounter: Payer: Self-pay | Admitting: *Deleted

## 2022-05-17 VITALS — BP 135/105 | HR 79 | Resp 16 | Ht 68.0 in | Wt 154.0 lb

## 2022-05-17 DIAGNOSIS — R053 Chronic cough: Secondary | ICD-10-CM

## 2022-05-17 DIAGNOSIS — J438 Other emphysema: Secondary | ICD-10-CM | POA: Diagnosis not present

## 2022-05-17 DIAGNOSIS — F17200 Nicotine dependence, unspecified, uncomplicated: Secondary | ICD-10-CM | POA: Diagnosis not present

## 2022-05-17 DIAGNOSIS — I10 Essential (primary) hypertension: Secondary | ICD-10-CM

## 2022-05-17 DIAGNOSIS — J329 Chronic sinusitis, unspecified: Secondary | ICD-10-CM

## 2022-05-17 NOTE — Telephone Encounter (Signed)
This encounter was created in error - please disregard.

## 2022-05-17 NOTE — Progress Notes (Signed)
I,Tiffany J Bragg,acting as a Neurosurgeon for [employer], PA-C.,have documented all relevant documentation on the behalf of Debera Lat, PA-C,as directed by  [employer], PA-C while in the presence of [employer], PA-C.   Established patient visit   Patient: Justin Olsen   DOB: 1966/01/09   56 y.o. Male  MRN: [id number] Visit Date: 05/17/2022  Today's healthcare provider: Debera Lat, PA-C   Chief Complaint  Patient presents with   Nasal Congestion    Patient complains of sinus congestion for a week and a half, productive cough.    Subjective    HPI HPI     Nasal Congestion    Additional comments: Patient complains of sinus congestion for a week and a half, productive cough.       Last edited by Marlana Salvage, CMA on 05/17/2022  3:36 PM.      Patient denies taking anything for his current conditions .  Denies having fever did not take anything.  States that he had a cold 2 months ago and since then he has been having cough, nasal congestion, and migraine/sinus headache.  Endorses having chest congestion with cough sometimes. Patient is a current day smoker.  Smokes 1 pack a day for the past for 42 years.  He was told that he has emphysema, and he has been taking Advair and albuterol but he has not been consistent with his treatment  Medications: Outpatient Medications Prior to Visit  Medication Sig   albuterol (VENTOLIN HFA) 108 (90 Base) MCG/ACT inhaler INHALE TWO PUFFS BY MOUTH EVERY 6 HOURS AS NEEDED FOR WHEEZING OR FOR SHORTNESS OF BREATH   Aspirin-Salicylamide-Caffeine (BC HEADACHE POWDER PO) Take by mouth 2 (two) times daily as needed.   clonazePAM (KLONOPIN) 0.5 MG tablet Take 1 tablet (0.5 mg total) by mouth at bedtime.   diltiazem (CARDIZEM CD) 240 MG 24 hr capsule Take 1 capsule (240 mg total) by mouth daily.   fluticasone-salmeterol (ADVAIR DISKUS) 250-50 MCG/ACT AEPB INHALE ONE PUFF BY MOUTH DAILY   gabapentin (NEURONTIN) 300 MG capsule TAKE ONE  CAPSULE BY MOUTH EVERY EVENING   hydrochlorothiazide (HYDRODIURIL) 25 MG tablet Take 1 tablet (25 mg total) by mouth daily.   naproxen (NAPROSYN) 500 MG tablet TAKE ONE TABLET BY MOUTH TWICE A DAY AS NEEDED   pantoprazole (PROTONIX) 40 MG tablet TAKE ONE TABLET BY MOUTH DAILY   rosuvastatin (CRESTOR) 20 MG tablet TAKE 1 TABLET BY MOUTH DAILY   No facility-administered medications prior to visit.    Review of Systems  All other systems reviewed and are negative. Except see HPI     Objective    BP (!) 135/105 (BP Location: Left Arm, Patient Position: Sitting, Cuff Size: Normal)   Pulse 79   Resp 16   Ht 5\' 8"  (1.727 m)   Wt 154 lb (69.9 kg)   SpO2 97%   BMI 23.42 kg/m    Physical Exam Vitals reviewed.  Constitutional:      General: He is in acute distress.     Appearance: Normal appearance. He is not ill-appearing, toxic-appearing or diaphoretic.  HENT:     Head: Normocephalic and atraumatic.     Right Ear: There is impacted cerumen.     Left Ear: Tympanic membrane, ear canal and external ear normal.     Nose: Congestion and rhinorrhea present.     Mouth/Throat:     Pharynx: No posterior oropharyngeal erythema.     Comments: Postnasal drainage present Eyes:  General: No scleral icterus.       Right eye: No discharge.        Left eye: No discharge.     Extraocular Movements: Extraocular movements intact.     Conjunctiva/sclera: Conjunctivae normal.     Pupils: Pupils are equal, round, and reactive to light.  Cardiovascular:     Rate and Rhythm: Normal rate and regular rhythm.     Pulses: Normal pulses.     Heart sounds: Normal heart sounds. No murmur heard. Pulmonary:     Effort: Pulmonary effort is normal. No respiratory distress.     Breath sounds: Rhonchi (mild) present. No wheezing.  Abdominal:     General: Abdomen is flat. Bowel sounds are normal.     Palpations: Abdomen is soft.  Musculoskeletal:        General: Normal range of motion.     Cervical  back: Normal range of motion and neck supple.     Right lower leg: No edema.     Left lower leg: No edema.  Lymphadenopathy:     Cervical: No cervical adenopathy.  Skin:    General: Skin is warm and dry.     Findings: No rash.  Neurological:     General: No focal deficit present.     Mental Status: He is alert and oriented to person, place, and time. Mental status is at baseline.  Psychiatric:        Behavior: Behavior normal.        Thought Content: Thought content normal.        Judgment: Judgment normal.      No results found for any visits on 05/17/22.  Assessment & Plan     1. Rhinosinusitis Resolving Could be due to allergies/"all year round" Advised symptomatic treatment with nasal saline rinse/spray, OTC flonase, OTC antihistamines, air humidifier. Proper hydration  2. Chronic cough 3. Smoker 4. Other emphysema (HCC) Chronic problem Exacerbated by cold 2 months ago Improving Normal vitals including oxygen saturation of 97% Recommended to use albuterol 108 PRN and Advair 250-50 inhalers daily. Pt was inconsistent with using inhalers.  Pt was provided with Bretzri sample for a trial. Benefits and risks were discussed. Patient agreed and expressed his understanding. Smoking cessation was strongly recommended.  Unclear if patient will make an attempt to quit smoking. We will revisit this topic at his next appointment FU as scheduled    5. Primary hypertension Chronic and unstable Blood pressure today was 135/105 No shortness of breath, no chest pain, normal vitals except blood pressure Patient was advised to measure his blood pressure at home And bring his blood pressure log to the next appointment The patient was advised to call back or seek an in-person evaluation if the symptoms worsen or if the condition fails to improve as anticipated. Continue his current medications: Diltiazem 240 mg per day and hydrochlorothiazide 25 Mg Will FU  I discussed the  assessment and treatment plan with the patient. The patient was provided an opportunity to ask questions and all were answered. The patient agreed with the plan and demonstrated an understanding of the instructions.  The entirety of the information documented in the History of Present Illness, Review of Systems and Physical Exam were personally obtained by me. Portions of this information were initially documented by the CMA and reviewed by me for thoroughness and accuracy.   Debera Lat, [facility], MMS [facility] [phone] (phone) [phone] (fax)

## 2022-05-17 NOTE — Telephone Encounter (Signed)
Patient called back and appt scheduled for today at 3:40 pm.

## 2022-05-17 NOTE — Telephone Encounter (Signed)
Called, left VM to call back. 

## 2022-05-20 ENCOUNTER — Other Ambulatory Visit: Payer: Self-pay | Admitting: Family Medicine

## 2022-05-20 DIAGNOSIS — I10 Essential (primary) hypertension: Secondary | ICD-10-CM

## 2022-05-20 DIAGNOSIS — R519 Headache, unspecified: Secondary | ICD-10-CM

## 2022-05-20 NOTE — Telephone Encounter (Signed)
Last seen 2 months ago .  Requested Prescriptions  Pending Prescriptions Disp Refills   diltiazem (CARDIZEM CD) 240 MG 24 hr capsule [Pharmacy Med Name: dilTIAZem 24H ER (CD) 240 MG CP] 90 capsule 0    Sig: TAKE ONE CAPSULE BY MOUTH DAILY     Cardiovascular: Calcium Channel Blockers 3 Failed - 05/20/2022  3:22 PM      Failed - Last BP in normal range    BP Readings from Last 1 Encounters:  05/17/22 (!) 135/105         Passed - ALT in normal range and within 360 days    ALT  Date Value Ref Range Status  01/13/2022 14 0 - 44 IU/L Final         Passed - AST in normal range and within 360 days    AST  Date Value Ref Range Status  01/13/2022 14 0 - 40 IU/L Final         Passed - Cr in normal range and within 360 days    Creatinine, Ser  Date Value Ref Range Status  01/13/2022 1.05 0.76 - 1.27 mg/dL Final         Passed - Last Heart Rate in normal range    Pulse Readings from Last 1 Encounters:  05/17/22 79         Passed - Valid encounter within last 6 months    Recent Outpatient Visits           3 days ago Rhinosinusitis   [facility], [place], PA-C   2 months ago Primary hypertension   [facility] Malva Limes, MD   3 months ago Primary hypertension   [place], [place], PA-C   4 months ago Dizziness   [facility] Edwards, [place], PA-C   7 months ago Primary hypertension   [facility], [name], MD       Future Appointments             In 3 months Gollan, Tollie Pizza, MD [facility]   In 3 months Fisher, [name], MD [facility]

## 2022-07-10 ENCOUNTER — Other Ambulatory Visit: Payer: Self-pay | Admitting: Cardiovascular Disease

## 2022-07-10 ENCOUNTER — Other Ambulatory Visit: Payer: Self-pay | Admitting: Family Medicine

## 2022-07-10 DIAGNOSIS — E7800 Pure hypercholesterolemia, unspecified: Secondary | ICD-10-CM

## 2022-07-10 DIAGNOSIS — M19041 Primary osteoarthritis, right hand: Secondary | ICD-10-CM

## 2022-07-11 NOTE — Telephone Encounter (Signed)
Requested Prescriptions  Pending Prescriptions Disp Refills   naproxen (NAPROSYN) 500 MG tablet [Pharmacy Med Name: NAPROXEN 500 MG TABLET] 180 tablet 0    Sig: TAKE 1 TABLET BY MOUTH TWICE A DAY AS NEEDED     Analgesics:  NSAIDS Failed - 07/10/2022  9:21 AM      Failed - Manual Review: Labs are only required if the patient has taken medication for more than 8 weeks.      Passed - Cr in normal range and within 360 days    Creatinine, Ser  Date Value Ref Range Status  01/13/2022 1.05 0.76 - 1.27 mg/dL Final         Passed - HGB in normal range and within 360 days    Hemoglobin  Date Value Ref Range Status  03/07/2022 15.2 13.0 - 17.0 g/dL Final  09/13/2021 14.4 13.0 - 17.7 g/dL Final         Passed - PLT in normal range and within 360 days    Platelets  Date Value Ref Range Status  03/07/2022 222 150 - 400 K/uL Final  09/13/2021 210 150 - 450 x10E3/uL Final         Passed - HCT in normal range and within 360 days    HCT  Date Value Ref Range Status  03/07/2022 42.4 39.0 - 52.0 % Final   Hematocrit  Date Value Ref Range Status  09/13/2021 41.4 37.5 - 51.0 % Final         Passed - eGFR is 30 or above and within 360 days    GFR, Estimated  Date Value Ref Range Status  11/06/2020 >60 >60 mL/min Final    Comment:    (NOTE) Calculated using the CKD-EPI Creatinine Equation (2021)    eGFR  Date Value Ref Range Status  01/13/2022 84 >59 mL/min/1.73 Final         Passed - Patient is not pregnant      Passed - Valid encounter within last 12 months    Recent Outpatient Visits           1 month ago [place], [place], PA-C   4 months ago Primary hypertension   [place], Donald E, MD   5 months ago Primary hypertension   [place] Mikey Kirschner, PA-C   5 months ago [place] Mikey Kirschner, PA-C   8 months  ago Primary hypertension   [place], Kirstie Peri, MD       Future Appointments             In 1 month Gollan, Kathlene November, MD [place] [facility]   In 1 month Fisher, Kirstie Peri, MD [facility] [place]

## 2022-07-12 ENCOUNTER — Telehealth: Payer: Self-pay | Admitting: Family Medicine

## 2022-07-12 DIAGNOSIS — G2581 Restless legs syndrome: Secondary | ICD-10-CM

## 2022-07-12 NOTE — Telephone Encounter (Signed)
Patient called pharmacy to refill his clonazePAM (KLONOPIN) 0.5 MG tablet and was told that it is currently on backorder and they do not know when it will be available. Patient wants to know what he should do. Please advise.

## 2022-07-14 NOTE — Telephone Encounter (Signed)
He needs to check with other pharmacy in town. It should be available as either 0.5mg  or 1mg  tablet somewhere

## 2022-07-15 MED ORDER — CLONAZEPAM 0.5 MG PO TABS: 0.5000 mg | ORAL_TABLET | Freq: Every day | ORAL | 1 refills | Status: DC

## 2022-07-15 NOTE — Telephone Encounter (Signed)
Attempted to call patient, LMTCB

## 2022-07-15 NOTE — Telephone Encounter (Signed)
Patient called and says that his pharmacy has his supply in stock now.

## 2022-07-25 ENCOUNTER — Telehealth: Payer: Self-pay | Admitting: Cardiovascular Disease

## 2022-07-25 NOTE — Telephone Encounter (Signed)
Spoke to patient and he stated that he was not actively having angina but that the chest pain he had experienced was light and  still lingering around. Patient denies dyspnea outside of COPD and exertional angina. Patient was scheduled for a follow-up visit 08/08/22 that has been rescheduled to 07/28/22. Patient voiced satisfaction and understanding.

## 2022-07-25 NOTE — Telephone Encounter (Signed)
Called patient to reschedule patient's appt on 3/1 due to schedule change for Dr. Rockey Situ. He states he has some concerns to be discussed before his appt, he rescheduled for 2/19, with Cadence Furth. Pt c/o of Chest Pain: STAT if CP now or developed within 24 hours  1. Are you having CP right now? Not at the moment, last experienced the pains on Thursday/Friday of last week.  2. Are you experiencing any other symptoms (ex. SOB, nausea, vomiting, sweating)? Sometimes it feels like someone is sitting on his chest.  3. How long have you been experiencing CP? For about a month, a little bit more than that, comes in spurts  4. Is your CP continuous or coming and going? Comes and goes  5. Have you taken Nitroglycerin? No  Patient states not to call before 11 am, he is at work with his Librarian, academic. ?

## 2022-07-27 ENCOUNTER — Encounter: Payer: Self-pay | Admitting: Physician Assistant

## 2022-07-27 NOTE — Progress Notes (Unsigned)
Cardiology Office Note    Date:  07/28/2022   ID:  Justin Olsen, DOB 1965/07/29, MRN [id number]  PCP:  Birdie Sons, MD  Cardiologist:  Ida Rogue, MD  Electrophysiologist:  None   Chief Complaint: Follow-up  History of Present Illness:   Justin Olsen is a 57 y.o. male with history of coronary artery calcification noted on CT imaging, aortic atherosclerosis, HTN, COPD, ongoing tobacco use at 1 pack/day for 31 years, and orthostasis who presents for evaluation of ongoing chest pain.  He was seen in the ED in 10/2020 with chest pain with negative high-sensitivity troponin.  He was found to be hypertensive.  He is subsequently established with Dr. Rockey Situ with calcium score in 12/2020 of 23.6, involving the LAD, which was the 68th percentile.  He was most recently seen in the office in 02/2022 with BP running in the 140s over 90s at home and 160/90 in the office.  He reported increased stress.  He reported some short-lived right-sided chest discomfort.  He continued to smoke 1 pack/day.  He quit taking aspirin.  He reported prior orthostasis with lisinopril and was continued on Cardizem 240 mg and HCTZ 12.5 mg.  Further cardiac testing was deferred.  He contacted our office on 07/25/2022 noting ongoing intermittent chest pain with recommendation to follow-up today.  He comes in accompanied by his wife today and reports a several month history of randomly occurring substernal chest pain that radiates towards the left shoulder.  Pain will last for several hours in duration.  He did have 1 episode where he felt "like a child was sitting on my chest."  Occasionally, these episodes have been associated with dyspnea.  No diaphoresis, nausea, vomiting, dizziness, presyncope, or syncope.  He is having these episodes of chest discomfort 2-3 times per week, last having an episode during this past week.  Currently chest pain-free.  He also reports intermittent palpitations that are not associated with  his chest discomfort.  Episodes can occur a couple of times per week, though at times he may go a week or more without palpitations.  He reports his blood pressure at home is typically in the 562Z to 308M systolic.  He follows with his PCP for this.  He does continue to smoke 1 pack/day and has done so for 31, almost 32 years.  He is not interested in quitting tobacco use.  He drinks 2-3 mixed drinks per night and is not interested in decreasing this.  He denies any illicit substance use.   Labs independently reviewed: 02/2022 - Hgb 15.2, PLT 222 12/2021 - A1c 5.7, BUN 17, serum creatinine 1.05, potassium 4.9, albumin 4.8, AST/ALT normal 06/2021 - TC 140, TG 361, HDL 37, LDL 31  Past Medical History:  Diagnosis Date   Aortic atherosclerosis (HCC)    CAD (coronary artery disease)    Emphysema lung (HCC)    Essential hypertension     History reviewed. No pertinent surgical history.  Current Medications: Current Meds  Medication Sig   albuterol (VENTOLIN HFA) 108 (90 Base) MCG/ACT inhaler INHALE TWO PUFFS BY MOUTH EVERY 6 HOURS AS NEEDED FOR WHEEZING OR FOR SHORTNESS OF BREATH   Aspirin-Salicylamide-Caffeine (BC HEADACHE POWDER PO) Take by mouth 2 (two) times daily as needed.   clonazePAM (KLONOPIN) 0.5 MG tablet Take 1 tablet (0.5 mg total) by mouth at bedtime.   diltiazem (CARDIZEM CD) 240 MG 24 hr capsule TAKE ONE CAPSULE BY MOUTH DAILY   fluticasone-salmeterol (ADVAIR DISKUS) 250-50  MCG/ACT AEPB INHALE ONE PUFF BY MOUTH DAILY   gabapentin (NEURONTIN) 300 MG capsule TAKE ONE CAPSULE BY MOUTH EVERY EVENING   hydrochlorothiazide (HYDRODIURIL) 25 MG tablet Take 1 tablet (25 mg total) by mouth daily.   metoprolol tartrate (LOPRESSOR) 100 MG tablet Take 1 tablet (100 mg total) by mouth once for 1 dose. Take 2 hours prior to your procedure.   naproxen (NAPROSYN) 500 MG tablet TAKE 1 TABLET BY MOUTH TWICE A DAY AS NEEDED   pantoprazole (PROTONIX) 40 MG tablet TAKE ONE TABLET BY MOUTH DAILY    rosuvastatin (CRESTOR) 20 MG tablet TAKE 1 TABLET BY MOUTH DAILY    Allergies:   Lisinopril   Social History   Socioeconomic History   Marital status: Married    Spouse name: Not on file   Number of children: Not on file   Years of education: Not on file   Highest education level: Not on file  Occupational History   Not on file  Tobacco Use   Smoking status: Every Day    Packs/day: 1.00    Years: 30.00    Total pack years: 30.00    Types: Cigarettes   Smokeless tobacco: Never  Vaping Use   Vaping Use: Never used  Substance and Sexual Activity   Alcohol use: Yes    Alcohol/week: 6.0 standard drinks of alcohol    Types: 4 Cans of beer, 2 Shots of liquor per week    Comment: 4 beer once a week, 5th every 3 days   Drug use: Not Currently   Sexual activity: Not on file  Other Topics Concern   Not on file  Social History Narrative   Not on file   Social Determinants of Health   Financial Resource Strain: Not on file  Food Insecurity: Not on file  Transportation Needs: Not on file  Physical Activity: Not on file  Stress: Not on file  Social Connections: Not on file     Family History:  The patient's family history includes Cancer in his maternal grandfather and paternal grandfather; Diabetes in his father and mother; Heart disease in his father; Stroke in his mother.  ROS:   12-point review of systems is negative unless otherwise noted in HPI.   EKGs/Labs/Other Studies Reviewed:    Studies reviewed were summarized above. The additional studies were reviewed today:  Calcium score 01/04/2021: Ascending Aorta: Normal size   Pericardium: Normal   Coronary arteries: Normal origin of left and right coronary arteries. Distribution of arterial calcifications if present, as noted below;   LM 0   LAD 23.6   LCx 0   RCA 0   Total 23.6   IMPRESSION AND RECOMMENDATION: 1. Coronary calcium score of 23.6. This was 68th percentile for age and sex matched  control. 2. CAC 1-99 in LAD. CAC-DRS A1/N1. 3. Continue heart healthy lifestyle and risk factor modification.   EKG:  EKG is ordered today.  The EKG ordered today demonstrates NSR, 68 bpm, no acute ST-T changes  Recent Labs: 03/07/2022: Hemoglobin 15.2; Platelets 222 07/28/2022: ALT 20; BUN 16; Creatinine, Ser 1.01; Potassium 3.3; Sodium 138  Recent Lipid Panel    Component Value Date/Time   CHOL 154 07/28/2022 0924   TRIG 169 (H) 07/28/2022 0924   HDL 49 07/28/2022 0924   CHOLHDL 3.1 07/28/2022 0924   VLDL 34 07/28/2022 0924   LDLCALC 71 07/28/2022 0924    PHYSICAL EXAM:    VS:  BP (!) 152/88 (BP Location: Left Arm, Patient  Position: Sitting, Cuff Size: Normal)   Pulse 68   Ht 5\' 8"  (1.727 m)   Wt 154 lb 12.8 oz (70.2 kg)   SpO2 95%   BMI 23.54 kg/m   BMI: Body mass index is 23.54 kg/m.  Physical Exam Vitals reviewed.  Constitutional:      Appearance: He is well-developed.  HENT:     Head: Normocephalic and atraumatic.  Eyes:     General:        Right eye: No discharge.        Left eye: No discharge.  Neck:     Vascular: No JVD.  Cardiovascular:     Rate and Rhythm: Normal rate and regular rhythm.     Pulses:          Posterior tibial pulses are 2+ on the right side and 2+ on the left side.     Heart sounds: Normal heart sounds, S1 normal and S2 normal. Heart sounds not distant. No midsystolic click and no opening snap. No murmur heard.    No friction rub.  Pulmonary:     Effort: Pulmonary effort is normal. No respiratory distress.     Breath sounds: Normal breath sounds. No decreased breath sounds, wheezing or rales.  Chest:     Chest wall: No tenderness.  Abdominal:     General: There is no distension.  Musculoskeletal:     Cervical back: Normal range of motion.     Right lower leg: No edema.     Left lower leg: No edema.  Skin:    General: Skin is warm and dry.     Nails: There is no clubbing.  Neurological:     Mental Status: He is alert and oriented  to person, place, and time.  Psychiatric:        Speech: Speech normal.        Behavior: Behavior normal.        Thought Content: Thought content normal.        Judgment: Judgment normal.     Wt Readings from Last 3 Encounters:  07/28/22 154 lb 12.8 oz (70.2 kg)  05/17/22 154 lb (69.9 kg)  03/11/22 158 lb (71.7 kg)     ASSESSMENT & PLAN:   CAD involving the native coronary arteries with precordial pain/aortic atherosclerosis: Currently without symptoms of angina or cardiac decompensation.  He is at risk for ischemic heart disease given multiple risk factors including known coronary artery calcification, ongoing tobacco use, hypertension, and male sex category.  Schedule coronary CTA.  Continue aspirin 81 mg daily and rosuvastatin 20 mg.  HTN: Blood pressure is on the high side today.  He has previously reported orthostasis with ACE inhibitor.  Currently on Cardizem CD to 40 mg and HCTZ 25 mg.  He reports this is monitored and addressed by his PCP.  Palpitations: Plan for ZIO XT following coronary CTA.  He remains on Cardizem CD to 40 mg.  Tobacco use: Continues to smoke 1 pack/day and has done so for almost 32 years.  Not interested in quitting.  Risks were discussed.  Complete cessation is recommended.  Alcohol use: Drinks 2-3 mixed drinks per night.  Complete cessation is encouraged.  Likely contributing to elevated BP.   Disposition: F/u with Dr. Rockey Situ or an APP in 4 weeks.   Medication Adjustments/Labs and Tests Ordered: Current medicines are reviewed at length with the patient today.  Concerns regarding medicines are outlined above. Medication changes, Labs and Tests ordered today are  summarized above and listed in the Patient Instructions accessible in Encounters.   Signed, Christell Faith, PA-C 07/28/2022 4:08 PM     [place] [address] [place], [place] [address] [phone]

## 2022-07-28 ENCOUNTER — Encounter: Payer: Self-pay | Admitting: Physician Assistant

## 2022-07-28 ENCOUNTER — Ambulatory Visit (INDEPENDENT_AMBULATORY_CARE_PROVIDER_SITE_OTHER): Payer: BC Managed Care – PPO

## 2022-07-28 ENCOUNTER — Other Ambulatory Visit
Admission: RE | Admit: 2022-07-28 | Discharge: 2022-07-28 | Disposition: A | Discharge status: Home / Self Care | Payer: BC Managed Care – PPO | Attending: Physician Assistant | Admitting: Physician Assistant

## 2022-07-28 ENCOUNTER — Telehealth: Payer: Self-pay | Admitting: *Deleted

## 2022-07-28 ENCOUNTER — Ambulatory Visit: Payer: BC Managed Care – PPO | Attending: Cardiovascular Disease | Admitting: Physician Assistant

## 2022-07-28 VITALS — BP 152/88 | HR 68 | Ht 68.0 in | Wt 154.8 lb

## 2022-07-28 DIAGNOSIS — R002 Palpitations: Secondary | ICD-10-CM

## 2022-07-28 DIAGNOSIS — Z720 Tobacco use: Secondary | ICD-10-CM

## 2022-07-28 DIAGNOSIS — I700 Atherosclerosis of aorta: Secondary | ICD-10-CM

## 2022-07-28 DIAGNOSIS — I25118 Atherosclerotic heart disease of native coronary artery with other forms of angina pectoris: Secondary | ICD-10-CM

## 2022-07-28 DIAGNOSIS — R072 Precordial pain: Secondary | ICD-10-CM | POA: Insufficient documentation

## 2022-07-28 DIAGNOSIS — Z8342 Family history of familial hypercholesterolemia: Secondary | ICD-10-CM | POA: Diagnosis not present

## 2022-07-28 DIAGNOSIS — E7800 Pure hypercholesterolemia, unspecified: Secondary | ICD-10-CM | POA: Diagnosis not present

## 2022-07-28 DIAGNOSIS — I10 Essential (primary) hypertension: Secondary | ICD-10-CM | POA: Insufficient documentation

## 2022-07-28 DIAGNOSIS — Z789 Other specified health status: Secondary | ICD-10-CM

## 2022-07-28 LAB — COMPREHENSIVE METABOLIC PANEL
ALT: 20 U/L (ref 0–44)
AST: 26 U/L (ref 15–41)
Albumin: 4.4 g/dL (ref 3.5–5.0)
Alkaline Phosphatase: 62 U/L (ref 38–126)
Anion gap: 9 (ref 5–15)
BUN: 16 mg/dL (ref 6–20)
CO2: 28 mmol/L (ref 22–32)
Calcium: 9.1 mg/dL (ref 8.9–10.3)
Chloride: 101 mmol/L (ref 98–111)
Creatinine, Ser: 1.01 mg/dL (ref 0.61–1.24)
GFR, Estimated: >60 mL/min (ref >60)
Glucose, Bld: 97 mg/dL (ref 70–99)
Potassium: 3.3 mmol/L — ABNORMAL LOW (ref 3.5–5.1)
Sodium: 138 mmol/L (ref 135–145)
Total Bilirubin: 1 mg/dL (ref 0.3–1.2)
Total Protein: 7.4 g/dL (ref 6.5–8.1)

## 2022-07-28 LAB — LIPID PANEL
Cholesterol: 154 mg/dL (ref 0–200)
HDL: 49 mg/dL (ref >40)
LDL Cholesterol: 71 mg/dL (ref 0–99)
Total CHOL/HDL Ratio: 3.1 RATIO
Triglycerides: 169 mg/dL — ABNORMAL HIGH (ref <150)
VLDL: 34 mg/dL (ref 0–40)

## 2022-07-28 LAB — LDL CHOLESTEROL, DIRECT: Direct LDL: 91 mg/dL (ref 0–99)

## 2022-07-28 MED ORDER — METOPROLOL TARTRATE 100 MG PO TABS: 100.0000 mg | ORAL_TABLET | Freq: Once | ORAL | 0 refills | Status: DC

## 2022-07-28 MED ORDER — POTASSIUM CHLORIDE ER 10 MEQ PO TBCR: 10.0000 meq | EXTENDED_RELEASE_TABLET | Freq: Every day | ORAL | 3 refills | Status: DC

## 2022-07-28 NOTE — Telephone Encounter (Signed)
-----   Message from Rise Mu, PA-C sent at 07/28/2022 12:45 PM EST ----- Triglycerides mildly elevated in a nonfasting sample LDL okay at 71 Potassium mildly low Kidney and liver function normal  Recommendations: -Start KCl 10 mEq daily (on HCTZ) -Recheck BMP in 1 week to ensure improved and stable potassium

## 2022-07-28 NOTE — Telephone Encounter (Signed)
Reviewed results and recommendations with patient and he verbalized understanding with no further questions at this time.

## 2022-07-28 NOTE — Patient Instructions (Addendum)
Medication Instructions:  No changes at this time.   *If you need a refill on your cardiac medications before your next appointment, please call your pharmacy*   Lab Work: CMET, Lipid, and direct LDL to be done today over at the [facility]. Stop at registration desk to check in.   If you have labs (blood work) drawn today and your tests are completely normal, you will receive your results only by: [place] (if you have MyChart) OR A paper copy in the mail If you have any lab test that is abnormal or we need to change your treatment, we will call you to review the results.   Testing/Procedures:   Your cardiac CT will be scheduled at the below location:    [facility] [address] [place], [place] [address] [phone]  Scheduled for 08/18/22 at 1:15 pm. Please arrive at 1:00 pm for check-in and registration.    Please follow these instructions carefully (unless otherwise directed):  Hold all erectile dysfunction medications at least 3 days (72 hrs) prior to test. (Ie viagra, cialis, sildenafil, tadalafil, etc) We will administer nitroglycerin during this exam.   On the Night Before the Test: Be sure to Drink plenty of water. Do not consume any caffeinated/decaffeinated beverages or chocolate 12 hours prior to your test. Do not take any antihistamines 12 hours prior to your test.   On the Day of the Test: Drink plenty of water until 1 hour prior to the test. Do not eat any food 1 hour prior to test. You may take your regular medications prior to the test.  Take metoprolol (Lopressor) 100 mg two hours prior to test. If you take Furosemide/Hydrochlorothiazide/Spironolactone, please HOLD on the morning of the test. FEMALES- please wear underwire-free bra if available, avoid dresses & tight clothing       After the Test: Drink plenty of water. After receiving IV contrast, you may experience a mild flushed  feeling. This is normal. On occasion, you may experience a mild rash up to 24 hours after the test. This is not dangerous. If this occurs, you can take Benadryl 25 mg and increase your fluid intake. If you experience trouble breathing, this can be serious. If it is severe call 911 IMMEDIATELY. If it is mild, please call our office. If you take any of these medications: Glipizide/Metformin, Avandament, Glucavance, please do not take 48 hours after completing test unless otherwise instructed.   For non-scheduling related questions, please contact the cardiac imaging nurse navigator should you have any questions/concerns: Marchia Bond, Cardiac Imaging Nurse Navigator Gordy Clement, Cardiac Imaging Nurse Navigator [facility] Direct Office Dial: [phone]   For scheduling needs, including cancellations and rescheduling, please call [place], [phone].  Your physician has recommended that you wear a Zio monitor AFTER your CCTA. Wear monitor for 2 weeks.    This monitor is a medical device that records the heart's electrical activity. Doctors most often use these monitors to diagnose arrhythmias. Arrhythmias are problems with the speed or rhythm of the heartbeat. The monitor is a small device applied to your chest. You can wear one while you do your normal daily activities. While wearing this monitor if you have any symptoms to push the button and record what you felt. Once you have worn this monitor for the period of time provider prescribed (Usually 14 days), you will return the monitor device in the postage paid box. Once it is returned they  will download the data collected and provide [place] with a report which the provider will then review and we will call you with those results. Important tips:  Avoid showering during the first 24 hours of wearing the monitor. Avoid excessive sweating to help maximize wear time. Do not submerge the device, no hot tubs, and no  swimming pools. Keep any lotions or oils away from the patch. After 24 hours you may shower with the patch on. Take brief showers with your back facing the shower head.  Do not remove patch once it has been placed because that will interrupt data and decrease adhesive wear time. Push the button when you have any symptoms and write down what you were feeling. Once you have completed wearing your monitor, remove and place into box which has postage paid and place in your outgoing mailbox.  If for some reason you have misplaced your box then call our office and we can provide another box and/or mail it off for you.   Follow-Up: At [facility], you and your health needs are our priority.  As part of our continuing mission to provide you with exceptional heart care, we have created designated Provider Care Teams.  These Care Teams include your primary Cardiologist (physician) and Advanced Practice Providers (APPs -  Physician Assistants and Nurse Practitioners) who all work together to provide you with the care you need, when you need it.   Your next appointment:   1 month(s)  Provider:   Ida Rogue, MD or Christell Faith, PA-C

## 2022-08-02 NOTE — Addendum Note (Signed)
Addended by: James Ivanoff D on: 08/02/2022 08:26 AM   Modules accepted: Orders

## 2022-08-08 ENCOUNTER — Ambulatory Visit: Payer: BC Managed Care – PPO | Admitting: Medical

## 2022-08-17 ENCOUNTER — Telehealth (HOSPITAL_COMMUNITY): Payer: Self-pay | Admitting: *Deleted

## 2022-08-17 NOTE — Telephone Encounter (Signed)
Attempted to call patient regarding upcoming cardiac CT appointment. °Left message on voicemail with name and callback number ° °Chrystian Ressler RN Navigator Cardiac Imaging °[facility] °[phone] Office °[phone] Cell ° °

## 2022-08-18 ENCOUNTER — Ambulatory Visit
Admission: RE | Admit: 2022-08-18 | Discharge: 2022-08-18 | Disposition: A | Discharge status: Home / Self Care | Payer: BC Managed Care – PPO | Source: Ambulatory Visit | Attending: Physician Assistant | Admitting: Physician Assistant

## 2022-08-18 DIAGNOSIS — R072 Precordial pain: Secondary | ICD-10-CM | POA: Insufficient documentation

## 2022-08-18 MED ORDER — NITROGLYCERIN 0.4 MG SL SUBL
0.8000 mg | SUBLINGUAL_TABLET | Freq: Once | SUBLINGUAL | Status: AC
  Administered 2022-08-18: 0.8 mg via SUBLINGUAL

## 2022-08-18 MED ORDER — IOHEXOL 350 MG/ML SOLN
75.0000 mL | Freq: Once | INTRAVENOUS | Status: AC | PRN
  Administered 2022-08-18: 75 mL via INTRAVENOUS

## 2022-08-18 NOTE — Progress Notes (Addendum)
Argentina Ponder [place],acting as a scribe for Lelon Huh, MD.,have documented all relevant documentation on the behalf of Lelon Huh, MD,as directed by  Lelon Huh, MD while in the presence of Lelon Huh, MD.     Established patient visit   Patient: Justin Olsen   DOB: [birth year]   57 y.o. Male  MRN: [id number] Visit Date: 08/19/2022  Today's healthcare provider: Lelon Huh, MD    Subjective    HPI  Hypertension, follow-up  BP Readings from Last 3 Encounters:  08/19/22 129/84  08/18/22 114/85  07/28/22 (!) 152/88   Wt Readings from Last 3 Encounters:  08/19/22 160 lb (72.6 kg)  07/28/22 154 lb 12.8 oz (70.2 kg)  05/17/22 154 lb (69.9 kg)     He was last seen for hypertension 6 months ago.  BP at that visit was 143/88. Management since that visit includes increased HCTZ.to 25 mg daily  He reports good compliance with treatment. He is not having side effects.  He is following a Regular diet. He is exercising. He does smoke.  Use of agents associated with hypertension: none.   Outside blood pressures are 130's-140's over 80's.  He also reports he has been having more headaches recently similar, although not as intense as headaches he was having before starting blood pressure medications.  Symptoms: No chest pain No chest pressure  Yes palpitations No syncope  No dyspnea No orthopnea  No paroxysmal nocturnal dyspnea No lower extremity edema   Pertinent labs Lab Results  Component Value Date   CHOL 154 07/28/2022   HDL 49 07/28/2022   LDLCALC 71 07/28/2022   LDLDIRECT 91 07/28/2022   TRIG 169 (H) 07/28/2022   CHOLHDL 3.1 07/28/2022   Lab Results  Component Value Date   NA 138 07/28/2022   K 3.3 (L) 07/28/2022   CREATININE 1.01 07/28/2022   GFRNONAA >60 07/28/2022   GLUCOSE 97 07/28/2022     The 10-year ASCVD risk score (Arnett DK, et al., 2019) is:  8.7%  --------------------------------------------------------------------------------------------------- He is also seeing cardiology for chest pains and palpitations, had cardiac scan yesterday and is wearing on ambulatory monitor. He was also started on potassium supplement about a month ago.   He is also here to follow up on COPD. He stopped Advair since he didn't feel like it was helping. He uses albuterol every morning and sometimes twice a day.    Medications: Outpatient Medications Prior to Visit  Medication Sig   albuterol (VENTOLIN HFA) 108 (90 Base) MCG/ACT inhaler INHALE TWO PUFFS BY MOUTH EVERY 6 HOURS AS NEEDED FOR WHEEZING OR FOR SHORTNESS OF BREATH   Aspirin-Salicylamide-Caffeine (BC HEADACHE POWDER PO) Take by mouth 2 (two) times daily as needed.   clonazePAM (KLONOPIN) 0.5 MG tablet Take 1 tablet (0.5 mg total) by mouth at bedtime.   diltiazem (CARDIZEM CD) 240 MG 24 hr capsule TAKE ONE CAPSULE BY MOUTH DAILY   fluticasone-salmeterol (ADVAIR DISKUS) 250-50 MCG/ACT AEPB INHALE ONE PUFF BY MOUTH DAILY   gabapentin (NEURONTIN) 300 MG capsule TAKE ONE CAPSULE BY MOUTH EVERY EVENING   hydrochlorothiazide (HYDRODIURIL) 25 MG tablet Take 1 tablet (25 mg total) by mouth daily.   naproxen (NAPROSYN) 500 MG tablet TAKE 1 TABLET BY MOUTH TWICE A DAY AS NEEDED   pantoprazole (PROTONIX) 40 MG tablet TAKE ONE TABLET BY MOUTH DAILY   potassium chloride (KLOR-CON) 10 MEQ tablet Take 1 tablet (10 mEq total) by mouth daily.   rosuvastatin (CRESTOR) 20 MG tablet TAKE 1 TABLET BY  MOUTH DAILY   No facility-administered medications prior to visit.    Review of Systems  Neurological:  Positive for headaches.        Objective    BP 129/84 (BP Location: Left Arm, Patient Position: Sitting, Cuff Size: Normal)   Pulse 70   Temp 98.6 F (37 C) (Oral)   Wt 160 lb (72.6 kg)   SpO2 99%   BMI 24.33 kg/m    Physical Exam  General appearance: Well developed, well nourished male,  cooperative and in no acute distress Head: Normocephalic, without obvious abnormality, atraumatic Respiratory: Respirations even and unlabored, normal respiratory rate Extremities: All extremities are intact.  Skin: Skin color, texture, turgor normal. No rashes seen  Psych: Appropriate mood and affect. Neurologic: Mental status: Alert, oriented to person, place, and time, thought content appropriate.    Assessment & Plan     1. Primary hypertension Office reading is good, but home readings considerably higher.  Increase diltiazem (CARDIZEM CD) from 240 to 300 MG 24 hr capsule; Take 1 capsule (300 mg total) by mouth daily.  Dispense: 90 capsule; Refill: 0  2. Hypokalemia Now on 44meq potassium for the last month per cardiology.  - Renal function panel - Magnesium  3. Chronic intractable headache, unspecified headache type Similar to headaches he had before starting blood pressure medications. Will increase to  diltiazem (CARDIZEM CD) 300 MG 24 hr capsule; Take 1 capsule (300 mg total) by mouth daily.  Dispense: 90 capsule; Refill: 0  4. Elevated blood sugar  - Hemoglobin A1c  5. Centrilobular emphysema ([place])  Stopped Advair since he didn't feel like it was doing much, but still using albuterol every day.  Try samples Fluticasone-Umeclidin-Vilant (TRELEGY ELLIPTA) 100-62.5-25 MCG/ACT AEPB; Inhale 1 puff into the lungs daily.  Dispense: 1 each; Refill: 0   Call for prescription if effective     The entirety of the information documented in the History of Present Illness, Review of Systems and Physical Exam were personally obtained by me. Portions of this information were initially documented by the CMA and reviewed by me for thoroughness and accuracy.     Lelon Huh, MD  [facility] [phone] (phone) [phone] (fax)  [place]

## 2022-08-18 NOTE — Progress Notes (Signed)
Patient tolerated procedure well. Ambulate w/o difficulty. Denies light headedness or being dizzy. Sitting in chair drinking water provided. Encouraged to drink extra water today and reasoning explained. Verbalized understanding. All questions answered. ABC intact. No further needs. Discharge from procedure area w/o issues.   °

## 2022-08-19 ENCOUNTER — Other Ambulatory Visit: Payer: Self-pay | Admitting: Family Medicine

## 2022-08-19 ENCOUNTER — Ambulatory Visit: Payer: BC Managed Care – PPO | Admitting: Cardiovascular Disease

## 2022-08-19 ENCOUNTER — Ambulatory Visit (INDEPENDENT_AMBULATORY_CARE_PROVIDER_SITE_OTHER): Payer: BC Managed Care – PPO | Admitting: Family Medicine

## 2022-08-19 VITALS — BP 129/84 | HR 70 | Temp 98.6°F | Wt 160.0 lb

## 2022-08-19 DIAGNOSIS — R002 Palpitations: Secondary | ICD-10-CM

## 2022-08-19 DIAGNOSIS — M180 Bilateral primary osteoarthritis of first carpometacarpal joints: Secondary | ICD-10-CM | POA: Diagnosis not present

## 2022-08-19 DIAGNOSIS — R519 Headache, unspecified: Secondary | ICD-10-CM

## 2022-08-19 DIAGNOSIS — I10 Essential (primary) hypertension: Secondary | ICD-10-CM

## 2022-08-19 DIAGNOSIS — R739 Hyperglycemia, unspecified: Secondary | ICD-10-CM

## 2022-08-19 DIAGNOSIS — G8929 Other chronic pain: Secondary | ICD-10-CM

## 2022-08-19 DIAGNOSIS — R072 Precordial pain: Secondary | ICD-10-CM

## 2022-08-19 DIAGNOSIS — J432 Centrilobular emphysema: Secondary | ICD-10-CM

## 2022-08-19 DIAGNOSIS — E876 Hypokalemia: Secondary | ICD-10-CM

## 2022-08-19 MED ORDER — TRELEGY ELLIPTA 100-62.5-25 MCG/ACT IN AEPB: 1.0000 | INHALATION_SPRAY | Freq: Every day | RESPIRATORY_TRACT | 0 refills | Status: DC

## 2022-08-19 MED ORDER — DILTIAZEM HCL ER COATED BEADS 300 MG PO CP24: 300.0000 mg | ORAL_CAPSULE | Freq: Every day | ORAL | 0 refills | Status: DC

## 2022-08-19 NOTE — Telephone Encounter (Signed)
Unable to refill per protocol, Rx expired. Discontinued 08/19/22, dose change.  Requested Prescriptions  Pending Prescriptions Disp Refills   diltiazem (CARDIZEM CD) 240 MG 24 hr capsule [Pharmacy Med Name: dilTIAZem 24H ER (CD) 240 MG CP] 90 capsule 0    Sig: TAKE 1 CAPSULE BY MOUTH DAILY     Cardiovascular: Calcium Channel Blockers 3 Passed - 08/19/2022  6:21 AM      Passed - ALT in normal range and within 360 days    ALT  Date Value Ref Range Status  07/28/2022 20 0 - 44 U/L Final         Passed - AST in normal range and within 360 days    AST  Date Value Ref Range Status  07/28/2022 26 15 - 41 U/L Final         Passed - Cr in normal range and within 360 days    Creatinine, Ser  Date Value Ref Range Status  07/28/2022 1.01 0.61 - 1.24 mg/dL Final         Passed - Last BP in normal range    BP Readings from Last 1 Encounters:  08/19/22 129/84         Passed - Last Heart Rate in normal range    Pulse Readings from Last 1 Encounters:  08/19/22 70         Passed - Valid encounter within last 6 months    Recent Outpatient Visits           Today Primary hypertension   [place], Donald E, MD   3 months ago [place] We[place], [place], PA-C   5 months ago Primary hypertension   [place], Donald E, MD   6 months ago Primary hypertension   [place] Mikey Kirschner, PA-C   7 months ago [place] Mikey Kirschner, PA-C       Future Appointments             In 1 week Gollan, Kathlene November, MD [facility] [place]   In 3 months Fisher, Kirstie Peri, MD [facility], [place]

## 2022-08-19 NOTE — Patient Instructions (Addendum)
Please review the attached list of medications and notify my office if there are any errors.   Start taking 1 puff of Trelegy once every day. Let know if this is helping before the samples run out, and I'll send in a prescription. Marland Kitchen

## 2022-08-22 DIAGNOSIS — M180 Bilateral primary osteoarthritis of first carpometacarpal joints: Secondary | ICD-10-CM | POA: Diagnosis not present

## 2022-08-25 ENCOUNTER — Other Ambulatory Visit: Payer: Self-pay | Admitting: Orthopedic Surgery

## 2022-08-28 NOTE — Progress Notes (Unsigned)
Cardiology Office Note  Date:  08/29/2022   ID:  Justin Olsen, DOB 11-18-1965, MRN [id number]  PCP:  Justin Sons, MD   Chief Complaint  Patient presents with   1 month follow up     Patient c/o frequent headaches. Patient is currently wearing a Zio monitor. Patient needs a cardiac clearance for right thumb carpometacarpal arthroplasty on September 06, 2022 by Justin Olsen. Medications reviewed by the patient verbally.     HPI:  Justin Olsen is a 57 y.o. male who presents to the  Copd Smoker, 1 ppd HTN emergency department 11/06/20 for chest pain.   located in the center chest and radiated to his right shoulder.  tightness.   similar spells like this in the past.  Chronic headaches Who presents for follow-up of his chest pain, cardiac risk factors  Last seen by myself in clinic June 2022 Seen by one of our providers July 28, 2022 On that visit reported chest pain  Cardiac CTA Aug 18 2022 results reviewed with him in detail  low calcium score 31 Nonobstructive coronary disease, minimal proximal first diagonal stenosis  Carotid ultrasound August 2023, results reviewed today Minimal calcific plaque  Needs hand surgery, 09/06/22, dr Justin Olsen  Continues to have episodic chronic H/A, slowly getting better,  Etiology unclear Continues to work in [employer] In the past takes [employer] powder  Smokes 1 ppd Lots of mountain dew  Takes Cardizem 300 in the morning, HCTZ 20 5 at night  Previously seen in the emergency room for general malaise, chest discomfort negative CXR and troponin negative x 2. Patient was found to be hypertensive  EKG personally reviewed by myself on todays visit NSR rate 58 bpm no significant ST-T wave changes   PMH:   has a past medical history of Aortic atherosclerosis ([place]), CAD (coronary artery disease), Emphysema lung ([place]), Essential hypertension, GERD (gastroesophageal reflux disease), Headache, and Wears dentures. Smoker Essential  hypertension  PSH:   History reviewed. No pertinent surgical history.  Current Outpatient Medications  Medication Sig Dispense Refill   albuterol (VENTOLIN HFA) 108 (90 Base) MCG/ACT inhaler INHALE TWO PUFFS BY MOUTH EVERY 6 HOURS AS NEEDED FOR WHEEZING OR FOR SHORTNESS OF BREATH 8.5 g 3   ASPIRIN 81 PO Take by mouth daily.     Aspirin-Salicylamide-Caffeine (BC HEADACHE POWDER PO) Take by mouth 2 (two) times daily as needed.     clonazePAM (KLONOPIN) 0.5 MG tablet Take 1 tablet (0.5 mg total) by mouth at bedtime. 90 tablet 1   diltiazem (CARDIZEM CD) 300 MG 24 hr capsule Take 1 capsule (300 mg total) by mouth daily. 90 capsule 0   Fluticasone-Umeclidin-Vilant (TRELEGY ELLIPTA) 100-62.5-25 MCG/ACT AEPB Inhale 1 puff into the lungs daily. 1 each 0   gabapentin (NEURONTIN) 300 MG capsule TAKE ONE CAPSULE BY MOUTH EVERY EVENING 90 capsule 5   hydrochlorothiazide (HYDRODIURIL) 25 MG tablet Take 1 tablet (25 mg total) by mouth daily. 90 tablet 1   naproxen (NAPROSYN) 500 MG tablet TAKE 1 TABLET BY MOUTH TWICE A DAY AS NEEDED 180 tablet 0   pantoprazole (PROTONIX) 40 MG tablet TAKE ONE TABLET BY MOUTH DAILY 90 tablet 3   potassium chloride (KLOR-CON) 10 MEQ tablet Take 1 tablet (10 mEq total) by mouth daily. 90 tablet 3   rosuvastatin (CRESTOR) 20 MG tablet TAKE 1 TABLET BY MOUTH DAILY 90 tablet 0   No current facility-administered medications for this visit.   Allergies:   Lisinopril   Social History:  The patient  reports that he has been smoking cigarettes. He has a 40.00 pack-year smoking history. He has never used smokeless tobacco. He reports current alcohol use of about 12.0 standard drinks of alcohol per week. He reports that he does not currently use drugs.   Family History:   family history includes Cancer in his maternal grandfather and paternal grandfather; Diabetes in his father and mother; Heart disease in his father; Stroke in his mother.    Review of Systems: Review of Systems   Constitutional: Negative.   HENT: Negative.    Respiratory: Negative.    Cardiovascular: Negative.   Gastrointestinal: Negative.   Musculoskeletal: Negative.   Neurological:  Positive for headaches.  Psychiatric/Behavioral: Negative.    All other systems reviewed and are negative.   PHYSICAL EXAM: VS:  BP 138/80 (BP Location: Left Arm, Patient Position: Sitting, Cuff Size: Normal)   Pulse (!) 58   Ht '5\' 8"'$  (1.727 m)   Wt 155 lb 2 oz (70.4 kg)   SpO2 97%   BMI 23.59 kg/m  , BMI Body mass index is 23.59 kg/m. Constitutional:  oriented to person, place, and time. No distress.  HENT:  Head: Grossly normal Eyes:  no discharge. No scleral icterus.  Neck: No JVD, no carotid bruits  Cardiovascular: Regular rate and rhythm, no murmurs appreciated Pulmonary/Chest: Clear to auscultation bilaterally, no wheezes or rails Abdominal: Soft.  no distension.  no tenderness.  Musculoskeletal: Normal range of motion Neurological:  normal muscle tone. Coordination normal. No atrophy Skin: Skin warm and dry Psychiatric: normal affect, pleasant   Recent Labs: 03/07/2022: Hemoglobin 15.2; Platelets 222 07/28/2022: ALT 20; BUN 16; Creatinine, Ser 1.01; Potassium 3.3; Sodium 138    Lipid Panel Lab Results  Component Value Date   CHOL 154 07/28/2022   HDL 49 07/28/2022   LDLCALC 71 07/28/2022   TRIG 169 (H) 07/28/2022      Wt Readings from Last 3 Encounters:  08/29/22 155 lb 2 oz (70.4 kg)  08/19/22 160 lb (72.6 kg)  07/28/22 154 lb 12.8 oz (70.2 kg)     ASSESSMENT AND PLAN:  Problem List Items Addressed This Visit     COPD (chronic obstructive pulmonary disease) ([place])   Other Visit Diagnoses     Coronary artery disease of native artery of native heart with stable angina pectoris ([place])    -  Primary   Aortic atherosclerosis ([place])       Essential hypertension       Tobacco use       Elevated LDL cholesterol level         Preop cardiovascular evaluation Acceptable risk for  hand surgery, no further cardiac testing needed No changes to medications needed  Headache Unable to exclude neck issues, has worked in [employer] for 30 years Appears unrelated to blood pressure Taking lots of BC powder for his headaches   Smoker Smokes 1 pack/day We have encouraged him to continue to work on weaning his cigarettes and smoking cessation. He will continue to work on this and does not want any assistance with chantix.    HTN Recommend he continue current dose diltiazem 300 daily with HCTZ 25  Atypical chest pain Long history of atypical symptoms, cardiac CTA performed with very mild nonobstructive coronary disease indicating chest pain from other etiology   Total encounter time more than 30 minutes  Greater than 50% was spent in counseling and coordination of care with the patient    Signed, Esmond Plants, M.D.,  Ph.D. [facility] [place], [place] [phone]

## 2022-08-29 ENCOUNTER — Telehealth: Payer: Self-pay | Admitting: Family Medicine

## 2022-08-29 ENCOUNTER — Telehealth: Payer: Self-pay | Admitting: *Deleted

## 2022-08-29 ENCOUNTER — Encounter: Payer: Self-pay | Admitting: Orthopedic Surgery

## 2022-08-29 ENCOUNTER — Encounter: Payer: Self-pay | Admitting: Cardiovascular Disease

## 2022-08-29 ENCOUNTER — Ambulatory Visit: Payer: BC Managed Care – PPO | Attending: Cardiovascular Disease | Admitting: Cardiovascular Disease

## 2022-08-29 VITALS — BP 138/80 | HR 58 | Ht 68.0 in | Wt 155.1 lb

## 2022-08-29 DIAGNOSIS — I10 Essential (primary) hypertension: Secondary | ICD-10-CM

## 2022-08-29 DIAGNOSIS — E7800 Pure hypercholesterolemia, unspecified: Secondary | ICD-10-CM

## 2022-08-29 DIAGNOSIS — R739 Hyperglycemia, unspecified: Secondary | ICD-10-CM | POA: Diagnosis not present

## 2022-08-29 DIAGNOSIS — J432 Centrilobular emphysema: Secondary | ICD-10-CM

## 2022-08-29 DIAGNOSIS — Z720 Tobacco use: Secondary | ICD-10-CM | POA: Diagnosis not present

## 2022-08-29 DIAGNOSIS — I25118 Atherosclerotic heart disease of native coronary artery with other forms of angina pectoris: Secondary | ICD-10-CM | POA: Diagnosis not present

## 2022-08-29 DIAGNOSIS — I700 Atherosclerosis of aorta: Secondary | ICD-10-CM | POA: Diagnosis not present

## 2022-08-29 DIAGNOSIS — E876 Hypokalemia: Secondary | ICD-10-CM | POA: Diagnosis not present

## 2022-08-29 MED ORDER — TRELEGY ELLIPTA 100-62.5-25 MCG/ACT IN AEPB: 1.0000 | INHALATION_SPRAY | Freq: Every day | RESPIRATORY_TRACT | 12 refills | Status: DC

## 2022-08-29 NOTE — Telephone Encounter (Signed)
Pt been notified medication sent to the pharmacy. 

## 2022-08-29 NOTE — Telephone Encounter (Signed)
Please advise 

## 2022-08-29 NOTE — Telephone Encounter (Signed)
trelegy 100 mcg sample is working and patient would like a prescription written for it to continue it

## 2022-08-29 NOTE — Telephone Encounter (Signed)
-----   Message from Emily Filbert, RN sent at 08/29/2022  2:11 PM EDT ----- Elwyn Reach notification received via email that this patient's ZIO XT is still pending return. Ordered by Thurmond Butts at his last office visit.   Didn't know if you wanted to follow up with the patient to see if he wore this and tried to return it.

## 2022-08-29 NOTE — Patient Instructions (Addendum)
Medication Instructions:  No changes  If you need a refill on your cardiac medications before your next appointment, please call your pharmacy.   Lab work: No new labs needed  Testing/Procedures: No new testing needed  Follow-Up: At [facility], you and your health needs are our priority.  As part of our continuing mission to provide you with exceptional heart care, we have created designated Provider Care Teams.  These Care Teams include your primary Cardiologist (physician) and Advanced Practice Providers (APPs -  Physician Assistants and Nurse Practitioners) who all work together to provide you with the care you need, when you need it.  You will need a follow up appointment as needed  Providers on your designated Care Team:   Christopher Berge, NP Ryan Dunn, PA-C Cadence Furth, PA-C  COVID-19 Vaccine Information can be found at: [email or web address] For questions related to vaccine distribution or appointments, please email [email or web address] or call [phone].    

## 2022-08-29 NOTE — Telephone Encounter (Signed)
Have sent prescription to [facility]

## 2022-08-29 NOTE — Telephone Encounter (Signed)
Spoke with patient and he is currently wearing heart monitor. There was a delay due to him having other testing. He is aware to mail back when completed. No further needs.

## 2022-08-29 NOTE — Telephone Encounter (Signed)
Pt is aware and voiced understanding

## 2022-08-29 NOTE — Telephone Encounter (Signed)
He should continue same dose of hctz. It's important for BP to be well controlled before surgery.

## 2022-08-30 LAB — RENAL FUNCTION PANEL
Albumin: 4.7 g/dL (ref 3.8–4.9)
BUN/Creatinine Ratio: 20 (ref 9–20)
BUN: 21 mg/dL (ref 6–24)
CO2: 24 mmol/L (ref 20–29)
Calcium: 10.2 mg/dL (ref 8.7–10.2)
Chloride: 97 mmol/L (ref 96–106)
Creatinine, Ser: 1.07 mg/dL (ref 0.76–1.27)
Glucose: 103 mg/dL — ABNORMAL HIGH (ref 70–99)
Phosphorus: 3.6 mg/dL (ref 2.8–4.1)
Potassium: 4 mmol/L (ref 3.5–5.2)
Sodium: 140 mmol/L (ref 134–144)
eGFR: 82 mL/min/{1.73_m2} (ref >59)

## 2022-08-30 LAB — HEMOGLOBIN A1C
Est. average glucose Bld gHb Est-mCnc: 120 mg/dL
Hgb A1c MFr Bld: 5.8 % — ABNORMAL HIGH (ref 4.8–5.6)

## 2022-08-30 LAB — MAGNESIUM: Magnesium: 1.9 mg/dL (ref 1.6–2.3)

## 2022-09-06 ENCOUNTER — Ambulatory Visit: Payer: BC Managed Care – PPO | Admitting: Anesthesiology

## 2022-09-06 ENCOUNTER — Encounter: Payer: Self-pay | Admitting: Orthopedic Surgery

## 2022-09-06 ENCOUNTER — Encounter
Admission: RE | Disposition: A | Discharge status: Home / Self Care | Payer: Self-pay | Source: Home / Self Care | Attending: Orthopedic Surgery

## 2022-09-06 ENCOUNTER — Other Ambulatory Visit: Payer: Self-pay

## 2022-09-06 ENCOUNTER — Ambulatory Visit
Admission: RE | Admit: 2022-09-06 | Discharge: 2022-09-06 | Disposition: A | Discharge status: Home / Self Care | Payer: BC Managed Care – PPO | Attending: Orthopedic Surgery | Admitting: Orthopedic Surgery

## 2022-09-06 ENCOUNTER — Ambulatory Visit: Payer: Self-pay

## 2022-09-06 DIAGNOSIS — Z79899 Other long term (current) drug therapy: Secondary | ICD-10-CM | POA: Insufficient documentation

## 2022-09-06 DIAGNOSIS — F17200 Nicotine dependence, unspecified, uncomplicated: Secondary | ICD-10-CM | POA: Diagnosis not present

## 2022-09-06 DIAGNOSIS — M180 Bilateral primary osteoarthritis of first carpometacarpal joints: Secondary | ICD-10-CM | POA: Insufficient documentation

## 2022-09-06 DIAGNOSIS — J449 Chronic obstructive pulmonary disease, unspecified: Secondary | ICD-10-CM | POA: Diagnosis not present

## 2022-09-06 DIAGNOSIS — M1811 Unilateral primary osteoarthritis of first carpometacarpal joint, right hand: Secondary | ICD-10-CM | POA: Diagnosis not present

## 2022-09-06 DIAGNOSIS — I10 Essential (primary) hypertension: Secondary | ICD-10-CM | POA: Diagnosis not present

## 2022-09-06 DIAGNOSIS — Z7951 Long term (current) use of inhaled steroids: Secondary | ICD-10-CM | POA: Insufficient documentation

## 2022-09-06 DIAGNOSIS — T7840XA Allergy, unspecified, initial encounter: Secondary | ICD-10-CM | POA: Diagnosis not present

## 2022-09-06 DIAGNOSIS — K219 Gastro-esophageal reflux disease without esophagitis: Secondary | ICD-10-CM | POA: Diagnosis not present

## 2022-09-06 DIAGNOSIS — M19041 Primary osteoarthritis, right hand: Secondary | ICD-10-CM | POA: Diagnosis not present

## 2022-09-06 DIAGNOSIS — I2510 Atherosclerotic heart disease of native coronary artery without angina pectoris: Secondary | ICD-10-CM | POA: Diagnosis not present

## 2022-09-06 HISTORY — DX: Gastro-esophageal reflux disease without esophagitis: K21.9

## 2022-09-06 HISTORY — DX: Headache, unspecified: R51.9

## 2022-09-06 HISTORY — PX: CARPOMETACARPAL (CMC) FUSION OF THUMB: SHX6290

## 2022-09-06 HISTORY — DX: Presence of dental prosthetic device (complete) (partial): Z97.2

## 2022-09-06 SURGERY — CARPOMETACARPAL (CMC) FUSION OF THUMB
Anesthesia: General | Site: Thumb | Laterality: Right

## 2022-09-06 MED ORDER — CEFAZOLIN SODIUM-DEXTROSE 2-3 GM-%(50ML) IV SOLR: INTRAVENOUS | Status: DC | PRN

## 2022-09-06 MED ORDER — DEXMEDETOMIDINE HCL IN NACL 200 MCG/50ML IV SOLN
INTRAVENOUS | Status: DC | PRN
  Administered 2022-09-06: 10 ug via INTRAVENOUS

## 2022-09-06 MED ORDER — PROPOFOL 10 MG/ML IV BOLUS
INTRAVENOUS | Status: DC | PRN
  Administered 2022-09-06: 150 mg via INTRAVENOUS

## 2022-09-06 MED ORDER — OXYCODONE HCL 5 MG/5ML PO SOLN: 5.0000 mg | Freq: Once | ORAL | Status: AC | PRN

## 2022-09-06 MED ORDER — FENTANYL CITRATE (PF) 100 MCG/2ML IJ SOLN
INTRAMUSCULAR | Status: DC | PRN
  Administered 2022-09-06 (×2): 50 ug via INTRAVENOUS

## 2022-09-06 MED ORDER — OXYCODONE HCL 5 MG PO TABS: 5.0000 mg | ORAL_TABLET | ORAL | 0 refills | Status: DC | PRN

## 2022-09-06 MED ORDER — MIDAZOLAM HCL 5 MG/5ML IJ SOLN
INTRAMUSCULAR | Status: DC | PRN
  Administered 2022-09-06: 2 mg via INTRAVENOUS

## 2022-09-06 MED ORDER — CEFAZOLIN SODIUM-DEXTROSE 2-4 GM/100ML-% IV SOLN
2.0000 g | INTRAVENOUS | Status: AC
  Administered 2022-09-06: 2 g via INTRAVENOUS

## 2022-09-06 MED ORDER — LACTATED RINGERS IV SOLN: INTRAVENOUS | Status: DC

## 2022-09-06 MED ORDER — FENTANYL CITRATE PF 50 MCG/ML IJ SOSY: 25.0000 ug | PREFILLED_SYRINGE | INTRAMUSCULAR | Status: DC | PRN

## 2022-09-06 MED ORDER — 0.9 % SODIUM CHLORIDE (POUR BTL) OPTIME
TOPICAL | Status: DC | PRN
  Administered 2022-09-06: 500 mL

## 2022-09-06 MED ORDER — OXYCODONE HCL 5 MG PO TABS
5.0000 mg | ORAL_TABLET | Freq: Once | ORAL | Status: AC | PRN
  Administered 2022-09-06: 5 mg via ORAL

## 2022-09-06 MED ORDER — BUPIVACAINE HCL (PF) 0.5 % IJ SOLN
INTRAMUSCULAR | Status: DC | PRN
  Administered 2022-09-06: 20 mL

## 2022-09-06 MED ORDER — KETOROLAC TROMETHAMINE 15 MG/ML IJ SOLN
INTRAMUSCULAR | Status: DC | PRN
  Administered 2022-09-06: 15 mg via INTRAVENOUS

## 2022-09-06 MED ORDER — ONDANSETRON HCL 4 MG/2ML IJ SOLN
INTRAMUSCULAR | Status: DC | PRN
  Administered 2022-09-06: 4 mg via INTRAVENOUS

## 2022-09-06 MED ORDER — DEXAMETHASONE SODIUM PHOSPHATE 4 MG/ML IJ SOLN
INTRAMUSCULAR | Status: DC | PRN
  Administered 2022-09-06: 8 mg via INTRAVENOUS

## 2022-09-06 SURGICAL SUPPLY — 28 items
APL PRP STRL LF DISP 70% ISPRP (MISCELLANEOUS) ×1
BNDG CMPR STD VLCR NS LF 5.8X3 (GAUZE/BANDAGES/DRESSINGS) ×1
BNDG ELASTIC 3X5.8 VLCR NS LF (GAUZE/BANDAGES/DRESSINGS) ×1 IMPLANT
BNDG GZE 12X3 1 PLY HI ABS (GAUZE/BANDAGES/DRESSINGS) ×1
BNDG STRETCH GAUZE 3IN X12FT (GAUZE/BANDAGES/DRESSINGS) ×1 IMPLANT
CHLORAPREP W/TINT 26 (MISCELLANEOUS) ×1 IMPLANT
COVER LIGHT HANDLE UNIVERSAL (MISCELLANEOUS) IMPLANT
CUFF TOURN SGL QUICK 18X4 (TOURNIQUET CUFF) IMPLANT
DRAPE FLUOR MINI C-ARM 54X84 (DRAPES) ×1 IMPLANT
GAUZE SPONGE 4X4 12PLY STRL (GAUZE/BANDAGES/DRESSINGS) ×1 IMPLANT
GAUZE XEROFORM 1X8 LF (GAUZE/BANDAGES/DRESSINGS) ×1 IMPLANT
GLOVE SURG SYN 9.0  PF PI (GLOVE) ×1
GLOVE SURG SYN 9.0 PF PI (GLOVE) ×1 IMPLANT
GOWN STRL REUS W/ TWL LRG LVL3 (GOWN DISPOSABLE) ×1 IMPLANT
GOWN STRL REUS W/TWL LRG LVL3 (GOWN DISPOSABLE) ×1
K-WIRE DBL END TROCAR 6X.062 (WIRE) ×1
KIT TURNOVER KIT A (KITS) ×1 IMPLANT
KWIRE DBL END TROCAR 6X.062 (WIRE) IMPLANT
NS IRRIG 500ML POUR BTL (IV SOLUTION) ×1 IMPLANT
PACK EXTREMITY ARMC (MISCELLANEOUS) ×1 IMPLANT
SPLINT CAST 1 STEP 3X12 (MISCELLANEOUS) ×1 IMPLANT
SUT ETHILON 4-0 (SUTURE) ×1
SUT ETHILON 4-0 FS2 18XMFL BLK (SUTURE) ×1
SUT VIC AB 0 CT2 27 (SUTURE) ×2 IMPLANT
SUT VIC AB 3-0 SH 27 (SUTURE) ×1
SUT VIC AB 3-0 SH 27X BRD (SUTURE) ×1 IMPLANT
SUTURE ETHLN 4-0 FS2 18XMF BLK (SUTURE) ×1 IMPLANT
SYSTEM IMPLANT TIGHTROPE MINI (Anchor) ×1 IMPLANT

## 2022-09-06 NOTE — Anesthesia Preprocedure Evaluation (Signed)
Anesthesia Evaluation  Patient identified by MRN, date of birth, ID band Patient awake    Reviewed: Allergy & Precautions, NPO status , Patient's Chart, lab work & pertinent test results  Airway Mallampati: III  TM Distance: >3 FB Neck ROM: full    Dental  (+) Edentulous Upper, Edentulous Lower   Pulmonary COPD,  COPD inhaler, Current Smoker   Pulmonary exam normal        Cardiovascular hypertension, On Medications + CAD  Normal cardiovascular exam     Neuro/Psych  Headaches  negative psych ROS   GI/Hepatic ,GERD  Medicated,,(+)     substance abuse  alcohol use  Endo/Other  negative endocrine ROS    Renal/GU      Musculoskeletal   Abdominal   Peds  Hematology negative hematology ROS (+)   Anesthesia Other Findings Past Medical History: No date: Aortic atherosclerosis (HCC) No date: CAD (coronary artery disease) No date: Emphysema lung (HCC) No date: Essential hypertension No date: GERD (gastroesophageal reflux disease) No date: Headache No date: Wears dentures     Comment:  full upper and lower  History reviewed. No pertinent surgical history.  BMI    Body Mass Index: 23.57 kg/m      Reproductive/Obstetrics negative OB ROS                             Anesthesia Physical Anesthesia Plan  ASA: 3  Anesthesia Plan: General LMA   Post-op Pain Management: Toradol IV (intra-op) and Ofirmev IV (intra-op)   Induction: Intravenous  PONV Risk Score and Plan: Dexamethasone, Ondansetron, Midazolam and Treatment may vary due to age or medical condition  Airway Management Planned: LMA  Additional Equipment:   Intra-op Plan:   Post-operative Plan: Extubation in OR  Informed Consent: I have reviewed the patients History and Physical, chart, labs and discussed the procedure including the risks, benefits and alternatives for the proposed anesthesia with the patient or authorized  representative who has indicated his/her understanding and acceptance.     Dental Advisory Given  Plan Discussed with: Anesthesiologist, CRNA and Surgeon  Anesthesia Plan Comments: (Patient consented for risks of anesthesia including but not limited to:  - adverse reactions to medications - damage to eyes, teeth, lips or other oral mucosa - nerve damage due to positioning  - sore throat or hoarseness - Damage to heart, brain, nerves, lungs, other parts of body or loss of life  Patient voiced understanding.)        Anesthesia Quick Evaluation

## 2022-09-06 NOTE — Anesthesia Postprocedure Evaluation (Signed)
Anesthesia Post Note  Patient: Leniel Friedberg Walby  Procedure(s) Performed: Right thumb carpometacarpal arthroplasty (Right: Thumb)  Patient location during evaluation: PACU Anesthesia Type: General Level of consciousness: awake and alert Pain management: pain level controlled Vital Signs Assessment: post-procedure vital signs reviewed and stable Respiratory status: spontaneous breathing, nonlabored ventilation, respiratory function stable and patient connected to nasal cannula oxygen Cardiovascular status: blood pressure returned to baseline and stable Postop Assessment: no apparent nausea or vomiting Anesthetic complications: no   No notable events documented.   Last Vitals:  Vitals:   09/06/22 1332 09/06/22 1345  BP: (!) 154/118 (!) 148/97  Pulse: 74 74  Resp: 12 13  Temp: (!) 35.7 C (!) 36.3 C  SpO2: 96% 94%    Last Pain:  Vitals:   09/06/22 1345  PainSc: 5                  Ilene Qua

## 2022-09-06 NOTE — Op Note (Signed)
09/06/2022  1:32 PM  PATIENT:  Justin Olsen  57 y.o. male  PRE-OPERATIVE DIAGNOSIS:  Osteoarthritis of carpometacarpal joint of both thumbs M18.0  POST-OPERATIVE DIAGNOSIS:  Osteoarthritis of carpometacarpal joint of both thumbs M18.0  PROCEDURE:  Procedure(s): Right thumb carpometacarpal arthroplasty (Right)  SURGEON: Laurene Footman, MD  ASSISTANTS: None  ANESTHESIA:   general  EBL:  Total I/O In: 750 [I.V.:750] Out: -   BLOOD ADMINISTERED:none  DRAINS: none   LOCAL MEDICATIONS USED:  MARCAINE    and Amount: 20 ml  SPECIMEN:  No Specimen  DISPOSITION OF SPECIMEN:  N/A  COUNTS:  YES  TOURNIQUET:   Total Tourniquet Time Documented: Forearm (Right) - 52 minutes Total: Forearm (Right) - 52 minutes   IMPLANTS: Mini tight rope x 1  DICTATION: .Dragon Dictation  patient was brought to the operating room and after adequate anesthesia was obtained tourniquet is applied to the left upper forearm.  After patient identification and timeout procedure was completed after having prepped and draped the arm tourniquet was raised to 250.  Incision was made along the radial side of the first metacarpal towards the radial styloid.  Subcutaneous nerves were preserved and the capsule was opened at the thumb [facility] joint with extensive fluid and degenerative changes and degenerative capsule present.  Capsule was elevated and the trapezium was removed with severe degenerative changes noted with collapse and subluxation of the joint.  After removal of most of the trapezium with finger traction the thumb could be brought back out to a more normal position.  A wire was then sent between the first and second metacarpals to provide [place]orage for the implant wire was then pulled through with the mini tight rope attached mini tight rope placed against the base of the first metacarpal and then the anchor on the second metacarpal side was passed through the sutures and knots placed with traction applied  to the thumb.  On pistoning under fluoroscopy there was no abnormal motion of the thumb CMC and it appeared stable.  Sutures were cut short on the implant and they were buried under the skin is much as possible with total of 20 cc half percent Sensorcaine infiltrated in the area of the incisions for postop analgesia.  Gelfoam place in defect to create scar tissue in former trapezium space.Capsule was closed using 3-0 Vicryl followed by 4-0 nylon for all skin wounds Xeroform 4 x 4 web roll and a thumb spica splint were applied followed by Ace wrap with tourniquet let down prior to application of splint.    PLAN OF CARE: Discharge to home after PACU  PATIENT DISPOSITION:  PACU - hemodynamically stable.

## 2022-09-06 NOTE — Discharge Instructions (Signed)
Keep arm elevated is much Ice to the back of the hand today and tomorrow what should help with pain and swelling Pain medicine as directed Call office if you are having problems  [phone] Try to move the other fingers but try not to move the thumb

## 2022-09-06 NOTE — Transfer of Care (Signed)
Immediate Anesthesia Transfer of Care Note  Patient: Justin Olsen  Procedure(s) Performed: Right thumb carpometacarpal arthroplasty (Right: Thumb)  Patient Location: PACU  Anesthesia Type: General LMA  Level of Consciousness: awake, alert  and patient cooperative  Airway and Oxygen Therapy: Patient Spontanous Breathing and Patient connected to supplemental oxygen  Post-op Assessment: Post-op Vital signs reviewed, Patient's Cardiovascular Status Stable, Respiratory Function Stable, Patent Airway and No signs of Nausea or vomiting  Post-op Vital Signs: Reviewed and stable  Complications: No notable events documented.

## 2022-09-06 NOTE — Anesthesia Procedure Notes (Signed)
Procedure Name: LMA Insertion Date/Time: 09/06/2022 12:20 PM  Performed by: Tobie Poet, CRNAPre-anesthesia Checklist: Patient identified, Emergency Drugs available, Suction available and Patient being monitored Patient Re-evaluated:Patient Re-evaluated prior to induction Oxygen Delivery Method: Circle system utilized Preoxygenation: Pre-oxygenation with 100% oxygen Induction Type: IV induction Ventilation: Mask ventilation without difficulty LMA: LMA inserted LMA Size: 5.0 Number of attempts: 1 Airway Equipment and Method: Oral airway Placement Confirmation: breath sounds checked- equal and bilateral Tube secured with: Tape Dental Injury: Teeth and Oropharynx as per pre-operative assessment

## 2022-09-06 NOTE — H&P (Signed)
Chief Complaint Patient presents with Right Thumb - Pain Discuss [facility] Arthroplasty   History of the Present Illness: Justin Olsen is a 57 y.o. male here today for follow-up of right CMC osteoarthritis.  He was previously seen by T. Rachelle Hora, PA-C. He comes in today to discuss possible treatment including arthroplasty; however, he does do fairly heavy work. He is accompanied by an adult male.  His right thumb is bothering him. 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 His right hand bothers him more than the left. 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  Currently, he is on a heart monitor. He had a CT of his heart. He has a history of hypertension and is taking medication. He is seeing Ida Rogue, MD, cardiologist. His primary care physician is Kirstie Peri. Caryn Section, MD. He has an appointment with Dr. Rockey Situ on 08/29/2022.  He lives in [place], [place].  I have reviewed past medical, surgical, social and family history, and allergies as documented in the EMR.  Past Medical History: Past Medical History: Diagnosis Date COPD (chronic obstructive pulmonary disease) (CMS-HCC) CVA (cerebrovascular accident due to intracerebral hemorrhage) (CMS-HCC) Depression GERD (gastroesophageal reflux disease) Hypertension  Past Surgical History: No past surgical history on file.  Past Family History: No family history on file.  Medications: Current Outpatient Medications Ordered in Epic Medication Sig Dispense Refill albuterol 90 mcg/actuation inhaler Inhale 2 inhalations into the lungs every 6 (six) hours as needed clonazePAM (KLONOPIN) 0.5 MG tablet Take 0.5 mg by mouth at bedtime dilTIAZem (CARDIZEM CD) 300 MG CD capsule Take 1 capsule by mouth once daily fluticasone propion-salmeteroL (ADVAIR DISKUS) 250-50 mcg/dose diskus inhaler Inhale 1 inhalation into the lungs once  daily fluticasone-umeclidinium-vilanterol (TRELEGY ELLIPTA) 100-62.5-25 mcg inhaler Inhale 1 Puff into the lungs once daily gabapentin (NEURONTIN) 300 MG capsule 300 mg 2 (two) times daily hydroCHLOROthiazide (HYDRODIURIL) 25 MG tablet Take 1 tablet by mouth once daily naproxen (NAPROSYN) 500 MG tablet Take 500 mg by mouth 2 (two) times daily pantoprazole (PROTONIX) 40 MG DR tablet Take 1 tablet by mouth once daily potassium chloride (KLOR-CON) 10 MEQ ER tablet Take 1 tablet by mouth once daily rosuvastatin (CRESTOR) 20 MG tablet Take 20 mg by mouth once daily  No current Epic-ordered facility-administered medications on file.  Allergies: Allergies Allergen Reactions Lisinopril Other (See Comments) Orthostasis   Body mass index is 23.63 kg/m.  Review of Systems: A comprehensive 14 point ROS was performed, reviewed, and the pertinent orthopaedic findings are documented in the HPI.  Vitals: 08/22/22 1419 BP: 126/84   General Physical Examination:  General/Constitutional: No apparent distress: well-nourished and well developed. Eyes: Pupils equal, round with synchronous movement. Lungs: Clear to auscultation HEENT: Normal Vascular: No edema, swelling or tenderness, except as noted in detailed exam. Cardiac: Heart rate and rhythm is regular. Integumentary: No impressive skin lesions present, except as noted in detailed exam. Neuro/Psych: Normal mood and affect, oriented to person, place, and time.  On exam, right hand, extension is about 25 degrees, abduction is probably 0 degrees, flexion is about 10 degrees. Good grip strength.  Radiographs:  No new imaging studies were obtained or reviewed today.  Assessment: ICD-10-CM 1. Osteoarthritis of carpometacarpal (CMC) joint of both thumbs M18.0  Plan:  The patient has clinical findings of right thumb CMC osteoarthritis.  I explained to the patient that he has right thumb CMC osteoarthritis. I have shown a video for  CMC  arthritis. I recommended a right thumb CMC arthroplasty. I advised him to consult with his cardiologist prior to the surgery. He will be out of work for 2 to 3 weeks. I explained to him that it will take at least 2 months to use his hand for doing his regular job.  The patient is scheduled for a right thumb CMC arthroplasty in the near future.  Surgical Risks:  The nature of the condition and the proposed procedure has been reviewed in detail with the patient. Surgical versus non-surgical options and prognosis for recovery have been reviewed and the inherent risks and benefits of each have been discussed including the risks of infection, bleeding, injury to nerves/blood vessels/tendons, incomplete relief of symptoms, persisting pain and/or stiffness, loss of function, complex regional pain syndrome, failure of the procedure, as appropriate.  Document Attestation: I, Wilmot, have reviewed and updated documentation for [facility], MD, utilizing Nuance DAX.   Electronically signed by Lauris Poag, MD at 08/23/2022 8:26 AM EST  Reviewed  H+P. No changes noted.  Additionally, patient has earings which cannot be removed.  Discussed risk of burn/skin inmjury with metal when using cautery needed for procedure.  Patient expressed understanding of this.

## 2022-09-07 ENCOUNTER — Encounter: Payer: Self-pay | Admitting: Orthopedic Surgery

## 2022-09-08 DIAGNOSIS — M180 Bilateral primary osteoarthritis of first carpometacarpal joints: Secondary | ICD-10-CM | POA: Diagnosis not present

## 2022-09-09 DIAGNOSIS — R002 Palpitations: Secondary | ICD-10-CM | POA: Diagnosis not present

## 2022-09-09 DIAGNOSIS — R072 Precordial pain: Secondary | ICD-10-CM | POA: Diagnosis not present

## 2022-10-03 ENCOUNTER — Ambulatory Visit: Payer: BC Managed Care – PPO | Attending: Orthopedic Surgery | Admitting: Occupational Therapy

## 2022-10-03 ENCOUNTER — Encounter: Payer: Self-pay | Admitting: Occupational Therapy

## 2022-10-03 DIAGNOSIS — M25641 Stiffness of right hand, not elsewhere classified: Secondary | ICD-10-CM | POA: Insufficient documentation

## 2022-10-03 DIAGNOSIS — M6281 Muscle weakness (generalized): Secondary | ICD-10-CM | POA: Diagnosis not present

## 2022-10-03 DIAGNOSIS — M79641 Pain in right hand: Secondary | ICD-10-CM | POA: Insufficient documentation

## 2022-10-03 DIAGNOSIS — M25631 Stiffness of right wrist, not elsewhere classified: Secondary | ICD-10-CM | POA: Diagnosis not present

## 2022-10-03 DIAGNOSIS — M25531 Pain in right wrist: Secondary | ICD-10-CM | POA: Diagnosis not present

## 2022-10-03 NOTE — Therapy (Signed)
[facility] [address], [place], [address] Phone: [phone]   Fax:  [phone]  Occupational Therapy Evaluation  Patient Details  Name: Justin Olsen MRN: [id number] Date of Birth: 05-16-1966 Referring Provider (OT): Cranston Neighbor PA   Encounter Date: 10/03/2022   OT End of Session - 10/03/22 1348     Visit Number 1    Number of Visits 16    Date for OT Re-Evaluation 11/28/22    OT Start Time 1035    OT Stop Time 1121    OT Time Calculation (min) 46 min    Activity Tolerance Patient tolerated treatment well    Behavior During Therapy [facility] for tasks assessed/performed             Past Medical History:  Diagnosis Date   Aortic atherosclerosis    CAD (coronary artery disease)    Emphysema lung    Essential hypertension    GERD (gastroesophageal reflux disease)    Headache    Wears dentures    full upper and lower    Past Surgical History:  Procedure Laterality Date   CARPOMETACARPAL (CMC) FUSION OF THUMB Right 09/06/2022   Procedure: Right thumb carpometacarpal arthroplasty;  Surgeon: Kennedy Bucker, MD;  Location: [facility];  Service: Orthopedics;  Laterality: Right;    There were no vitals filed for this visit.   Subjective Assessment - 10/03/22 1341     Subjective  I started taking my brace of last week and had some increased pain still numb this.  Sleeping with my brace.  At started wearing member is again about 75% of the time.  I put in some tomato plants.  Pain is about 7 out of 10.    Pertinent History 09/20/22 Ortho note - 09/06/22  R thumb CMC arthroplastly byg DR Rosita Kea - Impression: Osteoarthritis of carpometacarpal (CMC) joint of both thumbs (M18.0) Status post [facility] arthroplasty right (primary encounter diagnosis)  - sutures removed and sterri strips place - Plan: 1. Continue with thumb spica splint at night for 1 week 2. In 1 week wear thumb spica splint only at night, for 3  additional weeks 3. Oxycodone refilled 4. Prescription for hand therapy given. He will start next week 5. Follow-up with Dr. Rosita Kea in 4 weeks    Patient Stated Goals Want to get my pain better in my motion and strength in my right thumb and hand so I can do things around the house.  Worked in the yard, hunt, take care my garden and fish    Currently in Pain? Yes    Pain Score 7     Pain Location --   hand- thumb   Pain Orientation Right    Pain Descriptors / Indicators Aching;Tightness;Numbness;Sore;Tender    Pain Type Surgical pain    Pain Onset 1 to 4 weeks ago    Pain Frequency Constant               [facility] OT Assessment - 10/03/22 0001       Assessment   Medical Diagnosis R thumb CMC athroplasty    Referring Provider (OT) Cranston Neighbor PA    Onset Date/Surgical Date 09/06/22    Hand Dominance Right    Next MD Visit middle May      Prior Function   Vocation Full time employment    Leisure work in Holiday representative, fish, garden, hunt      AROM   Right Wrist Extension 40  Degrees    Right Wrist Flexion 50 Degrees    Right Wrist Radial Deviation 8 Degrees    Right Wrist Ulnar Deviation 10 Degrees      Right Hand AROM   R Thumb MCP 0-60 25 Degrees    R Thumb IP 0-80 30 Degrees    R Thumb Radial ABduction/ADduction 0-55 38   from 0   R Thumb Palmar ABduction/ADduction 0-45 30   from 20   R Thumb Opposition to Index --   pain opposition to 4th and 5th   R Index  MCP 0-90 80 Degrees    R Index PIP 0-100 95 Degrees    R Long  MCP 0-90 85 Degrees    R Long PIP 0-100 95 Degrees    R Ring  MCP 0-90 90 Degrees    R Ring PIP 0-100 100 Degrees    R Little  MCP 0-90 90 Degrees    R Little PIP 0-100 100 Degrees                      OT Treatments/Exercises (OP) - 10/03/22 0001       RUE Fluidotherapy   Number Minutes Fluidotherapy 8 Minutes    RUE Fluidotherapy Location Hand;Wrist    Comments 2 cycles of ice -decrease pain and edema , increase ROM             Reviewed with patient about reason for her surgeries pain control. Patient to decrease pain in the next few days to less than 2/10 so that OT can increase active range of motion and initiate strengthening towards the end of next week. Patient to do contrast with soft tissue massage to webspace 3 times a day.  Prior to active range of motion. Patient to wear thumb spica splint for the next 48 hours most of the time off for ADLs and home exercises. Pain-free active range of motion for wrist flexion extension as well as ulnar radial deviation 10 reps active assisted range. Thumb palmar radial abduction pain-free 10 reps Tendon glides 10 reps Opposition picking up 1 to 2 cm up check alternating digits pain-free         OT Education - 10/03/22 1348     Education Details Findings of evaluation and home program    Person(s) Educated Patient    Methods Explanation;Demonstration;Tactile cues;Verbal cues;Handout    Comprehension Verbal cues required;Verbalized understanding;Returned demonstration              OT Short Term Goals - 10/03/22 1355       OT SHORT TERM GOAL #1   Title Patient to be independent in home program to decrease pain and edema to less than 2/10 to increase active range of motion in thumb and wrist to initiate weaning of splint.    Baseline Patient arrived with pain 4/10 at the best and 7/10 with any range of motion or splint off.  Patient limited in thumb and wrist active range of motion as well as composite fist.  Patient to wear thumb spica for the next 48 hours most of the time.  Upon level patient reported last few days wearing splint more than 75% of time    Time 2    Period Weeks    Status New    Target Date 10/17/22               OT Long Term Goals - 10/03/22 1356       OT LONG  TERM GOAL #1   Title Right thumb x-ray range of motion improved within functional limits to grasp a glass and do buttons without any symptoms.    Baseline Pain at rest  7/10 with active range of motion.  Limited in opposition pain to fourth digit.  MC flexion 25-IP flexion 30.  Palmar abduction 30 degrees radial abduction 38 pain with adduction of thumb.    Time 5    Period Weeks    Status New    Target Date 11/07/22      OT LONG TERM GOAL #2   Title Right wrist active range of motion improved to within normal limits with no increase symptoms for patient to initiate strengthening as well as push and pull heavy door.    Baseline Wrist flexion with 50 extension 40.  Ulnar deviation to and and radial deviation 8 pain 7/10 radial wrist.  Thumb spica splint on more than 75% of the time.  Pain with any functional use.    Time 8    Period Weeks    Status New    Target Date 11/28/22      OT LONG TERM GOAL #3   Title Right grip and prehension strength increased to within normal limits for patient's age range to hold the plate, do buttons in the groceries more than at a gallon without increase symptoms    Baseline Grip and prehension not tested-patient 4 weeks.  Resting pain for 4/10 and with any active range of motion or attempt or without brace increased to 7/10.  Increase edema    Time 8    Period Weeks    Status New    Target Date 11/28/22                   Plan - 10/03/22 1350     Clinical Impression Statement Pt present at OT eval with diagnosis of R thumb CMC arthoplasty on 09/06/22 - pt is about 4 wks s/p.  Patient reported he attempted to take the thumb spica splint off but the arrived with pain 7/10 and right hand as well as some numbness over the dorsal hand.  Patient with increased edema and pain limiting his AROM thumb in all planes as well as composite fist and wrist active range of motion in all planes.  Patient pain at rest 4/10.  Patient was educated in home program to decrease pain and edema as well as increased active range of motion pain-free-wearing thumb spica for 24 to 48 hours -only taking off for ADL's and HEP.  Patient limited in  functional use of right dominant hand in ADL's and IADL's..  Patient can benefit from skilled OT services to decrease pain and edema, decrease scar tissue and increase motion and strength 10 to prior level of function.    OT Occupational Profile and History Problem Focused Assessment - Including review of records relating to presenting problem    Occupational performance deficits (Please refer to evaluation for details): ADL's;IADL's;Work;Play;Leisure    Body Structure / Function / Physical Skills ADL;Edema;Dexterity;Decreased knowledge of precautions;Flexibility;ROM;UE functional use;Scar mobility;Sensation;Pain;Strength;IADL    Rehab Potential Good    Clinical Decision Making Limited treatment options, no task modification necessary    Comorbidities Affecting Occupational Performance: None    Modification or Assistance to Complete Evaluation  No modification of tasks or assist necessary to complete eval    OT Frequency 2x / week    OT Duration 8 weeks    OT Treatment/Interventions Self-care/ADL training;Contrast Bath;Manual  Therapy;Passive range of motion;Scar mobilization;Fluidtherapy;Paraffin;Splinting;Patient/family education;Therapeutic exercise    Consulted and Agree with Plan of Care Patient             Patient will benefit from skilled therapeutic intervention in order to improve the following deficits and impairments:   Body Structure / Function / Physical Skills: ADL, Edema, Dexterity, Decreased knowledge of precautions, Flexibility, ROM, UE functional use, Scar mobility, Sensation, Pain, Strength, IADL       Visit Diagnosis: Pain in right hand  Pain in right wrist  Stiffness of right hand, not elsewhere classified  Stiffness of right wrist, not elsewhere classified  Muscle weakness (generalized)    Problem List Patient Active Problem List   Diagnosis Date Noted   Dizziness 01/13/2022   Bilateral hand numbness 01/13/2022   Primary osteoarthritis of both hands  10/15/2021   Restless leg syndrome 10/05/2021   History of lacunar cerebrovascular accident (CVA) 02/10/2021   Smoker 12/13/2020   Primary hypertension 12/13/2020   COPD (chronic obstructive pulmonary disease) 12/13/2020   GERD (gastroesophageal reflux disease) 12/13/2020    Oletta Cohn, OTR/L,CLT 10/03/2022, 1:59 PM  [facility] [address] [place], [address] Phone: [phone]   Fax:  [phone]  Name: Justin Olsen MRN: [id number] Date of Birth: 11-06-1965

## 2022-10-06 ENCOUNTER — Ambulatory Visit: Payer: BC Managed Care – PPO | Admitting: Occupational Therapy

## 2022-10-06 DIAGNOSIS — M6281 Muscle weakness (generalized): Secondary | ICD-10-CM | POA: Diagnosis not present

## 2022-10-06 DIAGNOSIS — M25631 Stiffness of right wrist, not elsewhere classified: Secondary | ICD-10-CM

## 2022-10-06 DIAGNOSIS — M79641 Pain in right hand: Secondary | ICD-10-CM | POA: Diagnosis not present

## 2022-10-06 DIAGNOSIS — M25641 Stiffness of right hand, not elsewhere classified: Secondary | ICD-10-CM | POA: Diagnosis not present

## 2022-10-06 DIAGNOSIS — M25531 Pain in right wrist: Secondary | ICD-10-CM | POA: Diagnosis not present

## 2022-10-06 NOTE — Therapy (Signed)
[facility] [address], [place], [address] Phone: [phone]   Fax:  [phone]  Occupational Therapy Treatment  Patient Details  Name: Justin Olsen MRN: [id number] Date of Birth: September 03, 1965 Referring Provider (OT): Cranston Neighbor PA   Encounter Date: 10/06/2022   OT End of Session - 10/06/22 1159     Visit Number 2    Number of Visits 16    Date for OT Re-Evaluation 11/28/22    OT Start Time 1159    OT Stop Time 1243    OT Time Calculation (min) 44 min    Activity Tolerance Patient tolerated treatment well    Behavior During Therapy [facility] for tasks assessed/performed             Past Medical History:  Diagnosis Date   Aortic atherosclerosis    CAD (coronary artery disease)    Emphysema lung    Essential hypertension    GERD (gastroesophageal reflux disease)    Headache    Wears dentures    full upper and lower    Past Surgical History:  Procedure Laterality Date   CARPOMETACARPAL (CMC) FUSION OF THUMB Right 09/06/2022   Procedure: Right thumb carpometacarpal arthroplasty;  Surgeon: Kennedy Bucker, MD;  Location: [facility];  Service: Orthopedics;  Laterality: Right;    There were no vitals filed for this visit.   Subjective Assessment - 10/06/22 1158     Subjective  I am doing some of the exercises but also in the garden  a lot - pain little better but still there - do have some increase motion- wearing splint more than 75% of time but because in garden and outside al ot    Pertinent History 09/20/22 Ortho note - 09/06/22  R thumb CMC arthroplastly byg DR Rosita Kea - Impression: Osteoarthritis of carpometacarpal (CMC) joint of both thumbs (M18.0) Status post [facility] arthroplasty right (primary encounter diagnosis)  - sutures removed and sterri strips place - Plan: 1. Continue with thumb spica splint at night for 1 week 2. In 1 week wear thumb spica splint only at night, for 3 additional weeks 3.  Oxycodone refilled 4. Prescription for hand therapy given. He will start next week 5. Follow-up with Dr. Rosita Kea in 4 weeks    Patient Stated Goals Want to get my pain better in my motion and strength in my right thumb and hand so I can do things around the house.  Worked in the yard, hunt, take care my garden and fish    Currently in Pain? Yes    Pain Score 5     Pain Location --   hand   Pain Orientation Right    Pain Descriptors / Indicators Aching;Tightness;Sore    Pain Type Surgical pain    Pain Onset More than a month ago                [facility] OT Assessment - 10/06/22 0001       AROM   Right Wrist Extension 52 Degrees    Right Wrist Flexion 60 Degrees    Right Wrist Radial Deviation 12 Degrees    Right Wrist Ulnar Deviation 25 Degrees      Right Hand AROM   R Thumb MCP 0-60 40 Degrees    R Thumb IP 0-80 50 Degrees    R Thumb Radial ABduction/ADduction 0-55 45    R Thumb Palmar ABduction/ADduction 0-45 50  Pain with opposition to 4th and 5th  ADD of thumb - pain           OT Treatments/Exercises (OP) - 10/06/22 0001       RUE Fluidotherapy   Number Minutes Fluidotherapy 8 Minutes    RUE Fluidotherapy Location Hand;Wrist    Comments decrease stiffness and pain            Soft tissue mobs to webspace and MC spreads - volar palm and wrist prior to AROM and AAROM   Reinforcewith patient about reason for her surgeries pain control. Patient to decrease pain to less than 2/10 so that OT can increase active range of motion and initiate strengthening towards the end of next week. Patient to do contrast with soft tissue massage to webspace 3 times a day.  Prior to active range of motion. Patient to wear thumb spica splint on and off 2 hrs at time - with heavy act Pain-free AAROM  wrist flexion/ extension as well as  UD and RD  10 reps  Thumb palmar /radial abduction pain-free 10 reps Tendon glides 10 reps THumb IP and MC flexion PROM - blocked 10  reps prior to opposition Opposition picking up 1  cm  foam block up alternating digits pain-free       OT Education - 10/06/22 1158     Education Details progress and changes to HEP    Person(s) Educated Patient    Methods Explanation;Demonstration;Tactile cues;Verbal cues;Handout    Comprehension Verbal cues required;Verbalized understanding;Returned demonstration              OT Short Term Goals - 10/03/22 1355       OT SHORT TERM GOAL #1   Title Patient to be independent in home program to decrease pain and edema to less than 2/10 to increase active range of motion in thumb and wrist to initiate weaning of splint.    Baseline Patient arrived with pain 4/10 at the best and 7/10 with any range of motion or splint off.  Patient limited in thumb and wrist active range of motion as well as composite fist.  Patient to wear thumb spica for the next 48 hours most of the time.  Upon level patient reported last few days wearing splint more than 75% of time    Time 2    Period Weeks    Status New    Target Date 10/17/22               OT Long Term Goals - 10/03/22 1356       OT LONG TERM GOAL #1   Title Right thumb x-ray range of motion improved within functional limits to grasp a glass and do buttons without any symptoms.    Baseline Pain at rest 7/10 with active range of motion.  Limited in opposition pain to fourth digit.  MC flexion 25-IP flexion 30.  Palmar abduction 30 degrees radial abduction 38 pain with adduction of thumb.    Time 5    Period Weeks    Status New    Target Date 11/07/22      OT LONG TERM GOAL #2   Title Right wrist active range of motion improved to within normal limits with no increase symptoms for patient to initiate strengthening as well as push and pull heavy door.    Baseline Wrist flexion with 50 extension 40.  Ulnar deviation to and and radial deviation 8 pain 7/10 radial wrist.  Thumb spica splint  on more than 75% of the time.  Pain with any  functional use.    Time 8    Period Weeks    Status New    Target Date 11/28/22      OT LONG TERM GOAL #3   Title Right grip and prehension strength increased to within normal limits for patient's age range to hold the plate, do buttons in the groceries more than at a gallon without increase symptoms    Baseline Grip and prehension not tested-patient 4 weeks.  Resting pain for 4/10 and with any active range of motion or attempt or without brace increased to 7/10.  Increase edema    Time 8    Period Weeks    Status New    Target Date 11/28/22                   Plan - 10/06/22 1159     Clinical Impression Statement Pt present at OT eval with diagnosis of R thumb CMC arthoplasty on 09/06/22 - pt  now 4 1/2 wks s/p.   At eval pain was 7/10 pain - recommend for pt to wear splint on and off 2 hrs at time -because he is active and in garden and yard during day. Pt show increase AROM In thumb and wrist -but digits stiff and opposition to 4th and 5th.  Patient  edema  decrease but still increase pain  limiting his AROM thumb in all planes as well as composite fist and wrist active range of motion in all planes.  Patient pain at rest 2/10.  Patient was educated in home program to decrease pain and edema as well as increased active range of motion pain-free-wearing thumb spica  on and off 2 hrs or with heavy activities.  Patient limited in functional use of right dominant hand in ADL's and IADL's..  Patient can benefit from skilled OT services to decrease pain and edema, decrease scar tissue and increase motion and strength 10 to prior level of function.    OT Occupational Profile and History Problem Focused Assessment - Including review of records relating to presenting problem    Occupational performance deficits (Please refer to evaluation for details): ADL's;IADL's;Work;Play;Leisure    Body Structure / Function / Physical Skills ADL;Edema;Dexterity;Decreased knowledge of  precautions;Flexibility;ROM;UE functional use;Scar mobility;Sensation;Pain;Strength;IADL    Rehab Potential Good    Clinical Decision Making Limited treatment options, no task modification necessary    Comorbidities Affecting Occupational Performance: None    Modification or Assistance to Complete Evaluation  No modification of tasks or assist necessary to complete eval    OT Frequency 2x / week    OT Duration 8 weeks    OT Treatment/Interventions Self-care/ADL training;Contrast Bath;Manual Therapy;Passive range of motion;Scar mobilization;Fluidtherapy;Paraffin;Splinting;Patient/family education;Therapeutic exercise    Consulted and Agree with Plan of Care Patient             Patient will benefit from skilled therapeutic intervention in order to improve the following deficits and impairments:   Body Structure / Function / Physical Skills: ADL, Edema, Dexterity, Decreased knowledge of precautions, Flexibility, ROM, UE functional use, Scar mobility, Sensation, Pain, Strength, IADL       Visit Diagnosis: Pain in right hand  Pain in right wrist  Stiffness of right wrist, not elsewhere classified  Muscle weakness (generalized)  Stiffness of right hand, not elsewhere classified    Problem List Patient Active Problem List   Diagnosis Date Noted   Dizziness 01/13/2022   Bilateral hand  numbness 01/13/2022   Primary osteoarthritis of both hands 10/15/2021   Restless leg syndrome 10/05/2021   History of lacunar cerebrovascular accident (CVA) 02/10/2021   Smoker 12/13/2020   Primary hypertension 12/13/2020   COPD (chronic obstructive pulmonary disease) 12/13/2020   GERD (gastroesophageal reflux disease) 12/13/2020    Oletta Cohn, OTR/L,CLT 10/06/2022, 12:53 PM  [facility] [address], [place], [address] Phone: [phone]   Fax:  [phone]  Name: Justin Olsen MRN: [id number] Date of Birth:  [birth year]

## 2022-10-08 ENCOUNTER — Other Ambulatory Visit: Payer: Self-pay | Admitting: Family Medicine

## 2022-10-08 ENCOUNTER — Other Ambulatory Visit: Payer: Self-pay | Admitting: Cardiovascular Disease

## 2022-10-08 DIAGNOSIS — I10 Essential (primary) hypertension: Secondary | ICD-10-CM

## 2022-10-08 DIAGNOSIS — M19042 Primary osteoarthritis, left hand: Secondary | ICD-10-CM

## 2022-10-08 DIAGNOSIS — E7800 Pure hypercholesterolemia, unspecified: Secondary | ICD-10-CM

## 2022-10-11 ENCOUNTER — Ambulatory Visit: Payer: BC Managed Care – PPO | Admitting: Occupational Therapy

## 2022-10-11 DIAGNOSIS — M25631 Stiffness of right wrist, not elsewhere classified: Secondary | ICD-10-CM | POA: Diagnosis not present

## 2022-10-11 DIAGNOSIS — M79641 Pain in right hand: Secondary | ICD-10-CM

## 2022-10-11 DIAGNOSIS — M25641 Stiffness of right hand, not elsewhere classified: Secondary | ICD-10-CM | POA: Diagnosis not present

## 2022-10-11 DIAGNOSIS — M25531 Pain in right wrist: Secondary | ICD-10-CM | POA: Diagnosis not present

## 2022-10-11 DIAGNOSIS — M6281 Muscle weakness (generalized): Secondary | ICD-10-CM

## 2022-10-11 NOTE — Therapy (Signed)
[facility] [address] [place], [address] Phone: [phone]   Fax:  [phone]  Occupational Therapy Treatment  Patient Details  Name: Justin Olsen MRN: [id number] Date of Birth: [birth year] Referring Provider (OT): Cranston Neighbor PA   Encounter Date: 10/11/2022   OT End of Session - 10/11/22 1531     Visit Number 3    Number of Visits 16    Date for OT Re-Evaluation 11/28/22    OT Start Time 1531    OT Stop Time 1615    OT Time Calculation (min) 44 min    Activity Tolerance Patient tolerated treatment well    Behavior During Therapy [facility] for tasks assessed/performed             Past Medical History:  Diagnosis Date   Aortic atherosclerosis    CAD (coronary artery disease)    Emphysema lung    Essential hypertension    GERD (gastroesophageal reflux disease)    Headache    Wears dentures    full upper and lower    Past Surgical History:  Procedure Laterality Date   CARPOMETACARPAL (CMC) FUSION OF THUMB Right 09/06/2022   Procedure: Right thumb carpometacarpal arthroplasty;  Surgeon: Kennedy Bucker, MD;  Location: [facility];  Service: Orthopedics;  Laterality: Right;    There were no vitals filed for this visit.   Subjective Assessment - 10/11/22 1531     Subjective  LIttle better but cannot grip or put any pressure on thumb- taking splint off 2 hrs on and off- working in yard but splint on    Pertinent History 09/20/22 Ortho note - 09/06/22  R thumb CMC arthroplastly byg DR Rosita Kea - Impression: Osteoarthritis of carpometacarpal (CMC) joint of both thumbs (M18.0) Status post [facility] arthroplasty right (primary encounter diagnosis)  - sutures removed and sterri strips place - Plan: 1. Continue with thumb spica splint at night for 1 week 2. In 1 week wear thumb spica splint only at night, for 3 additional weeks 3. Oxycodone refilled 4. Prescription for hand therapy given. He will start next week 5.  Follow-up with Dr. Rosita Kea in 4 weeks    Patient Stated Goals Want to get my pain better in my motion and strength in my right thumb and hand so I can do things around the house.  Worked in the yard, hunt, take care my garden and fish    Currently in Pain? Yes    Pain Score 4     Pain Location --   wrist and thumb   Pain Orientation Right    Pain Descriptors / Indicators Aching;Tightness    Pain Type Surgical pain    Pain Onset More than a month ago    Pain Frequency Intermittent                [facility] OT Assessment - 10/11/22 0001       AROM   Right Wrist Extension 60 Degrees    Right Wrist Flexion 70 Degrees    Right Wrist Radial Deviation 20 Degrees    Right Wrist Ulnar Deviation 20 Degrees                      OT Treatments/Exercises (OP) - 10/11/22 0001       RUE Fluidotherapy   Number Minutes Fluidotherapy 8 Minutes    RUE Fluidotherapy Location Hand;Wrist    Comments decrease stiffness and pain  Soft tissue mobs to webspace and MC spreads - volar palm and wrist prior to AROM and AAROM Scar massage and kinesiotape done 100 % pull X over scar to use during day until next visit for scar mobs    Patient to decrease pain to less than 2/10 so that OT can increase active range of motion and initiate strengthening  Patient to do contrast with soft tissue massage to webspace 3 times a day.  Prior to active range of motion. Patient to wear thumb spica splint on and off 2 hrs at time - with heavy act Pain-free AAROM  wrist flexion/ extension as well as  UD and RD  10 reps  Light PROM done this date  AAROM and AROM Thumb palmar /radial abduction pain-free 10 reps Tendon glides 10 reps Attempted isometric strength for thumb in PA but increase pain  THumb IP and MC flexion PROM - blocked 10 reps prior to opposition To all digits and sliding down 4th digit           OT Education - 10/11/22 1531     Education Details progress and changes to HEP     Person(s) Educated Patient    Methods Explanation;Demonstration;Tactile cues;Verbal cues;Handout    Comprehension Verbal cues required;Verbalized understanding;Returned demonstration              OT Short Term Goals - 10/03/22 1355       OT SHORT TERM GOAL #1   Title Patient to be independent in home program to decrease pain and edema to less than 2/10 to increase active range of motion in thumb and wrist to initiate weaning of splint.    Baseline Patient arrived with pain 4/10 at the best and 7/10 with any range of motion or splint off.  Patient limited in thumb and wrist active range of motion as well as composite fist.  Patient to wear thumb spica for the next 48 hours most of the time.  Upon level patient reported last few days wearing splint more than 75% of time    Time 2    Period Weeks    Status New    Target Date 10/17/22               OT Long Term Goals - 10/03/22 1356       OT LONG TERM GOAL #1   Title Right thumb x-ray range of motion improved within functional limits to grasp a glass and do buttons without any symptoms.    Baseline Pain at rest 7/10 with active range of motion.  Limited in opposition pain to fourth digit.  MC flexion 25-IP flexion 30.  Palmar abduction 30 degrees radial abduction 38 pain with adduction of thumb.    Time 5    Period Weeks    Status New    Target Date 11/07/22      OT LONG TERM GOAL #2   Title Right wrist active range of motion improved to within normal limits with no increase symptoms for patient to initiate strengthening as well as push and pull heavy door.    Baseline Wrist flexion with 50 extension 40.  Ulnar deviation to and and radial deviation 8 pain 7/10 radial wrist.  Thumb spica splint on more than 75% of the time.  Pain with any functional use.    Time 8    Period Weeks    Status New    Target Date 11/28/22      OT LONG TERM GOAL #  3   Title Right grip and prehension strength increased to within normal limits  for patient's age range to hold the plate, do buttons in the groceries more than at a gallon without increase symptoms    Baseline Grip and prehension not tested-patient 4 weeks.  Resting pain for 4/10 and with any active range of motion or attempt or without brace increased to 7/10.  Increase edema    Time 8    Period Weeks    Status New    Target Date 11/28/22                   Plan - 10/11/22 1531     Clinical Impression Statement Pt present at OT eval with diagnosis of R thumb CMC arthoplasty on 09/06/22 - pt  now 5 1/2 wks s/p.   At eval pain was 7/10 pain - recommend for pt to wear splint on and off 2 hrs at time -because he is active and in garden and yard during day. Pt cont to  show increase AROM In thumb and wrist -but digits stiff and opposition to 4th and 5th.  Patient  edema  decrease but still increase pain  limiting his AROM thumb in all planes and wrist active range of motion in all planes.  Patient pain at rest 2/10.  Patient was educated in home program to decrease pain and edema as well as increased active range of motion pain-free-wearing thumb spica  on and off 2 hrs or with heavy activities.  Patient limited in functional use of right dominant hand in ADL's and IADL's..  Patient can benefit from skilled OT services to decrease pain and edema, decrease scar tissue and increase motion and strength 10 to prior level of function.    OT Occupational Profile and History Problem Focused Assessment - Including review of records relating to presenting problem    Occupational performance deficits (Please refer to evaluation for details): ADL's;IADL's;Work;Play;Leisure    Body Structure / Function / Physical Skills ADL;Edema;Dexterity;Decreased knowledge of precautions;Flexibility;ROM;UE functional use;Scar mobility;Sensation;Pain;Strength;IADL    Rehab Potential Good    Clinical Decision Making Limited treatment options, no task modification necessary    Comorbidities Affecting  Occupational Performance: None    Modification or Assistance to Complete Evaluation  No modification of tasks or assist necessary to complete eval    OT Frequency 2x / week    OT Duration 8 weeks    OT Treatment/Interventions Self-care/ADL training;Contrast Bath;Manual Therapy;Passive range of motion;Scar mobilization;Fluidtherapy;Paraffin;Splinting;Patient/family education;Therapeutic exercise    Consulted and Agree with Plan of Care Patient             Patient will benefit from skilled therapeutic intervention in order to improve the following deficits and impairments:   Body Structure / Function / Physical Skills: ADL, Edema, Dexterity, Decreased knowledge of precautions, Flexibility, ROM, UE functional use, Scar mobility, Sensation, Pain, Strength, IADL       Visit Diagnosis: Pain in right hand  Pain in right wrist  Stiffness of right wrist, not elsewhere classified  Muscle weakness (generalized)  Stiffness of right hand, not elsewhere classified    Problem List Patient Active Problem List   Diagnosis Date Noted   Dizziness 01/13/2022   Bilateral hand numbness 01/13/2022   Primary osteoarthritis of both hands 10/15/2021   Restless leg syndrome 10/05/2021   History of lacunar cerebrovascular accident (CVA) 02/10/2021   Smoker 12/13/2020   Primary hypertension 12/13/2020   COPD (chronic obstructive pulmonary disease) 12/13/2020   GERD (  gastroesophageal reflux disease) 12/13/2020    Oletta Cohn, OTR/L,CLT 10/11/2022, 5:24 PM  [facility] [address], [place], [address] Phone: [phone]   Fax:  [phone]  Name: Justin Olsen MRN: [id number] Date of Birth: Jan 18, 1966

## 2022-10-13 ENCOUNTER — Ambulatory Visit: Payer: BC Managed Care – PPO | Admitting: Occupational Therapy

## 2022-10-18 ENCOUNTER — Encounter: Payer: Self-pay | Admitting: Occupational Therapy

## 2022-10-18 ENCOUNTER — Ambulatory Visit: Payer: BC Managed Care – PPO | Admitting: Occupational Therapy

## 2022-10-18 DIAGNOSIS — M79641 Pain in right hand: Secondary | ICD-10-CM | POA: Diagnosis not present

## 2022-10-18 DIAGNOSIS — M6281 Muscle weakness (generalized): Secondary | ICD-10-CM | POA: Diagnosis not present

## 2022-10-18 DIAGNOSIS — M25531 Pain in right wrist: Secondary | ICD-10-CM

## 2022-10-18 DIAGNOSIS — M25631 Stiffness of right wrist, not elsewhere classified: Secondary | ICD-10-CM | POA: Diagnosis not present

## 2022-10-18 DIAGNOSIS — M25641 Stiffness of right hand, not elsewhere classified: Secondary | ICD-10-CM

## 2022-10-18 NOTE — Therapy (Signed)
[facility] [address] [place], [address] Phone: [phone]   Fax:  [phone]  Occupational Therapy Treatment  Patient Details  Name: Justin Olsen MRN: [id number] Date of Birth: 03/18/1966 Referring Provider (OT): Cranston Neighbor PA   Encounter Date: 10/18/2022   OT End of Session - 10/18/22 1634     Visit Number 4    Number of Visits 16    Date for OT Re-Evaluation 11/28/22    OT Start Time 1445    OT Stop Time 1528    OT Time Calculation (min) 43 min    Activity Tolerance Patient tolerated treatment well    Behavior During Therapy [facility] for tasks assessed/performed             Past Medical History:  Diagnosis Date   Aortic atherosclerosis (HCC)    CAD (coronary artery disease)    Emphysema lung (HCC)    Essential hypertension    GERD (gastroesophageal reflux disease)    Headache    Wears dentures    full upper and lower    Past Surgical History:  Procedure Laterality Date   CARPOMETACARPAL (CMC) FUSION OF THUMB Right 09/06/2022   Procedure: Right thumb carpometacarpal arthroplasty;  Surgeon: Kennedy Bucker, MD;  Location: [facility];  Service: Orthopedics;  Laterality: Right;    There were no vitals filed for this visit.   Subjective Assessment - 10/18/22 1514     Subjective  I did take a pain pill last night.  My mom got sick and had to help a lot the last week with her in and out of the car or in and out of the doctor's of this.  I wore my brace little more.  Doing okay.    Pertinent History 09/20/22 Ortho note - 09/06/22  R thumb CMC arthroplastly byg DR Rosita Kea - Impression: Osteoarthritis of carpometacarpal (CMC) joint of both thumbs (M18.0) Status post [facility] arthroplasty right (primary encounter diagnosis)  - sutures removed and sterri strips place - Plan: 1. Continue with thumb spica splint at night for 1 week 2. In 1 week wear thumb spica splint only at night, for 3 additional weeks 3.  Oxycodone refilled 4. Prescription for hand therapy given. He will start next week 5. Follow-up with Dr. Rosita Kea in 4 weeks    Patient Stated Goals Want to get my pain better in my motion and strength in my right thumb and hand so I can do things around the house.  Worked in the yard, hunt, take care my garden and fish    Currently in Pain? No/denies                [facility] OT Assessment - 10/18/22 0001       Strength   Right Hand Lateral Pinch 3 lbs    Right Hand 3 Point Pinch 5 lbs    Left Hand Lateral Pinch 20 lbs    Left Hand 3 Point Pinch 15 lbs      Right Hand AROM   R Thumb Opposition to Index --   opposition  slide to 2nd fold of 5th            Wrist extension still decreased to 65 degrees flexion 75. Patient to continue with active assisted range of motion for wrist flexion extension ulnar radial deviation.  Did add table slides for wrist extension prior to 1 pound weight. 1 pound weight or 16 ounce hammer done 12 reps  in all planes Patient to do 2 times a day. Thumb palmar /radial abduction within functional limits able to tolerate asymmetric strengthening 12 reps all planes.         OT Treatments/Exercises (OP) - 10/18/22 0001       RUE Fluidotherapy   Number Minutes Fluidotherapy 8 Minutes    RUE Fluidotherapy Location Hand;Wrist    Comments decrease stiffness and pain             Soft tissue mobs to webspace and MC spreads - volar palm and wrist prior to AROM and AAROM Scar massage and kinesiotape done 100 % pull X over scar to use during day until next visit for scar mobs     Patient to wear thumb spica splint on and off 2 hrs at time - with heavy act Tendon glides 10 reps THumb IP and MC flexion PROM - blocked 10 reps prior to opposition Opposition improve for patient to do opposition to all digits sliding down fourth and fifth stop when feeling a pain less than a 2/10.             OT Education - 10/18/22 1634     Education Details  progress and changes to HEP    Person(s) Educated Patient    Methods Explanation;Demonstration;Tactile cues;Verbal cues;Handout    Comprehension Verbal cues required;Verbalized understanding;Returned demonstration              OT Short Term Goals - 10/03/22 1355       OT SHORT TERM GOAL #1   Title Patient to be independent in home program to decrease pain and edema to less than 2/10 to increase active range of motion in thumb and wrist to initiate weaning of splint.    Baseline Patient arrived with pain 4/10 at the best and 7/10 with any range of motion or splint off.  Patient limited in thumb and wrist active range of motion as well as composite fist.  Patient to wear thumb spica for the next 48 hours most of the time.  Upon level patient reported last few days wearing splint more than 75% of time    Time 2    Period Weeks    Status New    Target Date 10/17/22               OT Long Term Goals - 10/03/22 1356       OT LONG TERM GOAL #1   Title Right thumb x-ray range of motion improved within functional limits to grasp a glass and do buttons without any symptoms.    Baseline Pain at rest 7/10 with active range of motion.  Limited in opposition pain to fourth digit.  MC flexion 25-IP flexion 30.  Palmar abduction 30 degrees radial abduction 38 pain with adduction of thumb.    Time 5    Period Weeks    Status New    Target Date 11/07/22      OT LONG TERM GOAL #2   Title Right wrist active range of motion improved to within normal limits with no increase symptoms for patient to initiate strengthening as well as push and pull heavy door.    Baseline Wrist flexion with 50 extension 40.  Ulnar deviation to and and radial deviation 8 pain 7/10 radial wrist.  Thumb spica splint on more than 75% of the time.  Pain with any functional use.    Time 8    Period Weeks    Status New  Target Date 11/28/22      OT LONG TERM GOAL #3   Title Right grip and prehension strength  increased to within normal limits for patient's age range to hold the plate, do buttons in the groceries more than at a gallon without increase symptoms    Baseline Grip and prehension not tested-patient 4 weeks.  Resting pain for 4/10 and with any active range of motion or attempt or without brace increased to 7/10.  Increase edema    Time 8    Period Weeks    Status New    Target Date 11/28/22                   Plan - 10/18/22 1635     Clinical Impression Statement Pt present at OT eval with diagnosis of R thumb CMC arthoplasty on 09/06/22 - pt  now 6 wks s/p.   At eval pain was 7/10 pain - recommend for pt to wear splint on and off 2 hrs at time -because he is active and in garden and yard during day.  Patient shows increase active range of motion for wrist and thumb in all planes.  This date patient shows decreased pain and edema coming and and able to initiate some isometric strengthening for thumb in all planes as well as 1 pound weight for wrist and forearm in a planes.  Patient to keep pain under 2/10 pain.  Patient still to wear thumb spica with painful or heavy activities.  Patient limited in functional use of right dominant hand in ADL's and IADL's..  Patient can benefit from skilled OT services to decrease pain and edema, decrease scar tissue and increase motion and strength 10 to prior level of function.    OT Occupational Profile and History Problem Focused Assessment - Including review of records relating to presenting problem    Occupational performance deficits (Please refer to evaluation for details): ADL's;IADL's;Work;Play;Leisure    Body Structure / Function / Physical Skills ADL;Edema;Dexterity;Decreased knowledge of precautions;Flexibility;ROM;UE functional use;Scar mobility;Sensation;Pain;Strength;IADL    Rehab Potential Good    Clinical Decision Making Limited treatment options, no task modification necessary    Comorbidities Affecting Occupational Performance: None     Modification or Assistance to Complete Evaluation  No modification of tasks or assist necessary to complete eval    OT Frequency 2x / week    OT Duration 8 weeks    OT Treatment/Interventions Self-care/ADL training;Contrast Bath;Manual Therapy;Passive range of motion;Scar mobilization;Fluidtherapy;Paraffin;Splinting;Patient/family education;Therapeutic exercise    Consulted and Agree with Plan of Care Patient             Patient will benefit from skilled therapeutic intervention in order to improve the following deficits and impairments:   Body Structure / Function / Physical Skills: ADL, Edema, Dexterity, Decreased knowledge of precautions, Flexibility, ROM, UE functional use, Scar mobility, Sensation, Pain, Strength, IADL       Visit Diagnosis: Pain in right hand  Pain in right wrist  Stiffness of right wrist, not elsewhere classified  Muscle weakness (generalized)  Stiffness of right hand, not elsewhere classified    Problem List Patient Active Problem List   Diagnosis Date Noted   Dizziness 01/13/2022   Bilateral hand numbness 01/13/2022   Primary osteoarthritis of both hands 10/15/2021   Restless leg syndrome 10/05/2021   History of lacunar cerebrovascular accident (CVA) 02/10/2021   Smoker 12/13/2020   Primary hypertension 12/13/2020   COPD (chronic obstructive pulmonary disease) (HCC) 12/13/2020   GERD (gastroesophageal reflux disease)  12/13/2020    Oletta Cohn, OTR/L,CLT 10/18/2022, 4:38 PM  [place] [address], [place], [address] Phone: [phone]   Fax:  [phone]  Name: Justin Olsen MRN: [id number] Date of Birth: April 10, 1966

## 2022-10-20 ENCOUNTER — Ambulatory Visit: Payer: BC Managed Care – PPO | Attending: Orthopedic Surgery | Admitting: Occupational Therapy

## 2022-10-20 DIAGNOSIS — M25631 Stiffness of right wrist, not elsewhere classified: Secondary | ICD-10-CM | POA: Diagnosis not present

## 2022-10-20 DIAGNOSIS — M6281 Muscle weakness (generalized): Secondary | ICD-10-CM | POA: Diagnosis not present

## 2022-10-20 DIAGNOSIS — M25531 Pain in right wrist: Secondary | ICD-10-CM | POA: Diagnosis not present

## 2022-10-20 DIAGNOSIS — M79641 Pain in right hand: Secondary | ICD-10-CM | POA: Diagnosis not present

## 2022-10-20 DIAGNOSIS — M25641 Stiffness of right hand, not elsewhere classified: Secondary | ICD-10-CM | POA: Diagnosis not present

## 2022-10-20 NOTE — Therapy (Signed)
[facility] [address], [place], [address] Phone: [phone]   Fax:  [phone]  Occupational Therapy Treatment  Patient Details  Name: Justin Olsen MRN: [id number] Date of Birth: 08/16/1965 Referring Provider (OT): Cranston Neighbor PA   Encounter Date: 10/20/2022   OT End of Session - 10/20/22 2031     Visit Number 5    Number of Visits 16    Date for OT Re-Evaluation 11/28/22    OT Start Time 1450    OT Stop Time 1530    OT Time Calculation (min) 40 min    Activity Tolerance Patient tolerated treatment well    Behavior During Therapy [facility] for tasks assessed/performed             Past Medical History:  Diagnosis Date   Aortic atherosclerosis (HCC)    CAD (coronary artery disease)    Emphysema lung (HCC)    Essential hypertension    GERD (gastroesophageal reflux disease)    Headache    Wears dentures    full upper and lower    Past Surgical History:  Procedure Laterality Date   CARPOMETACARPAL (CMC) FUSION OF THUMB Right 09/06/2022   Procedure: Right thumb carpometacarpal arthroplasty;  Surgeon: Kennedy Bucker, MD;  Location: [facility];  Service: Orthopedics;  Laterality: Right;    There were no vitals filed for this visit.   Subjective Assessment - 10/20/22 1514     Subjective  I had to help my mom a lot - so I could not do my exercises as I shouild - but I can tell it is stronger to get dress and bath    Pertinent History 09/20/22 Ortho note - 09/06/22  R thumb CMC arthroplastly byg DR Rosita Kea - Impression: Osteoarthritis of carpometacarpal (CMC) joint of both thumbs (M18.0) Status post [facility] arthroplasty right (primary encounter diagnosis)  - sutures removed and sterri strips place - Plan: 1. Continue with thumb spica splint at night for 1 week 2. In 1 week wear thumb spica splint only at night, for 3 additional weeks 3. Oxycodone refilled 4. Prescription for hand therapy given. He will start next  week 5. Follow-up with Dr. Rosita Kea in 4 weeks    Patient Stated Goals Want to get my pain better in my motion and strength in my right thumb and hand so I can do things around the house.  Worked in the yard, hunt, take care my garden and fish    Currently in Pain? Yes    Pain Score 3     Pain Location --   radial hand/thumb area               [facility] OT Assessment - 10/20/22 0001       AROM   Right Wrist Extension 65 Degrees    Right Wrist Flexion 70 Degrees    Right Wrist Radial Deviation 20 Degrees    Right Wrist Ulnar Deviation 25 Degrees      Strength   Right Hand Grip (lbs) 29    Right Hand Lateral Pinch 4 lbs    Right Hand 3 Point Pinch 7 lbs    Left Hand Lateral Pinch 20 lbs    Left Hand 3 Point Pinch 15 lbs             Wrist extension and flexion improving Patient to continue with active assisted range of motion for wrist flexion extension ulnar radial deviation.  Review next visti table  slides for wrist extension prior to 1 pound weight. 1 pound weight or 16 ounce hammer increase to 2 x  12 reps in all planes Patient to do 2 times a day. Thumb palmar /radial abduction within functional limits able to tolerate isometric strengthening 12 reps all planes. 2 sets - 2 x day  Can increase to 3rd set if pain free in 3 days          OT Treatments/Exercises (OP) - 10/20/22 0001       RUE Fluidotherapy   RUE Fluidotherapy Location Hand;Wrist    Comments decrease stiffness and increase ROM             soft tissue mobs to webspace and MC spreads - volar palm and wrist prior to AROM and AAROM Scar massage and xtractor done this date with thumb AROM      Patient to wear thumb spica splint with heavy act and when helping parents Tendon glides 10 reps THumb IP and MC flexion PROM - blocked 10 reps prior to opposition Opposition improve to all digits sliding down fourth and fifth stop when feeling a pain less than a 2/10. 10 reps       OT Education -  10/20/22 2030     Education Details progress and changes to HEP    Person(s) Educated Patient    Methods Explanation;Demonstration;Tactile cues;Verbal cues;Handout    Comprehension Verbal cues required;Verbalized understanding;Returned demonstration              OT Short Term Goals - 10/03/22 1355       OT SHORT TERM GOAL #1   Title Patient to be independent in home program to decrease pain and edema to less than 2/10 to increase active range of motion in thumb and wrist to initiate weaning of splint.    Baseline Patient arrived with pain 4/10 at the best and 7/10 with any range of motion or splint off.  Patient limited in thumb and wrist active range of motion as well as composite fist.  Patient to wear thumb spica for the next 48 hours most of the time.  Upon level patient reported last few days wearing splint more than 75% of time    Time 2    Period Weeks    Status New    Target Date 10/17/22               OT Long Term Goals - 10/03/22 1356       OT LONG TERM GOAL #1   Title Right thumb x-ray range of motion improved within functional limits to grasp a glass and do buttons without any symptoms.    Baseline Pain at rest 7/10 with active range of motion.  Limited in opposition pain to fourth digit.  MC flexion 25-IP flexion 30.  Palmar abduction 30 degrees radial abduction 38 pain with adduction of thumb.    Time 5    Period Weeks    Status New    Target Date 11/07/22      OT LONG TERM GOAL #2   Title Right wrist active range of motion improved to within normal limits with no increase symptoms for patient to initiate strengthening as well as push and pull heavy door.    Baseline Wrist flexion with 50 extension 40.  Ulnar deviation to and and radial deviation 8 pain 7/10 radial wrist.  Thumb spica splint on more than 75% of the time.  Pain with any functional use.  Time 8    Period Weeks    Status New    Target Date 11/28/22      OT LONG TERM GOAL #3   Title  Right grip and prehension strength increased to within normal limits for patient's age range to hold the plate, do buttons in the groceries more than at a gallon without increase symptoms    Baseline Grip and prehension not tested-patient 4 weeks.  Resting pain for 4/10 and with any active range of motion or attempt or without brace increased to 7/10.  Increase edema    Time 8    Period Weeks    Status New    Target Date 11/28/22                   Plan - 10/20/22 2032     Clinical Impression Statement Pt present at OT eval with diagnosis of R thumb CMC arthoplasty on 09/06/22 - pt  now 6 1/2 wks s/p.   At eval pain was 7/10 pain - recommend for pt to wear splint on and off 2 hrs at time -because he is active and in garden and yard during day.  Patient shows increase active range of motion for wrist and thumb in all planes.  Show decrease pain this week to be able to initiate strengthening for wrist 1 lbs and isometric for thumb. Patient to keep pain under 2/10 pain.  Patient still to wear thumb spica with painful or heavy activities.  Patient limited in functional use of right dominant hand in ADL's and IADL's..  Patient can benefit from skilled OT services to decrease pain and edema, decrease scar tissue and increase motion and strength 10 to prior level of function.    OT Occupational Profile and History Problem Focused Assessment - Including review of records relating to presenting problem    Occupational performance deficits (Please refer to evaluation for details): ADL's;IADL's;Work;Play;Leisure    Body Structure / Function / Physical Skills ADL;Edema;Dexterity;Decreased knowledge of precautions;Flexibility;ROM;UE functional use;Scar mobility;Sensation;Pain;Strength;IADL    Rehab Potential Good    Clinical Decision Making Limited treatment options, no task modification necessary    Comorbidities Affecting Occupational Performance: None    Modification or Assistance to Complete  Evaluation  No modification of tasks or assist necessary to complete eval    OT Frequency 2x / week    OT Duration 8 weeks    OT Treatment/Interventions Self-care/ADL training;Contrast Bath;Manual Therapy;Passive range of motion;Scar mobilization;Fluidtherapy;Paraffin;Splinting;Patient/family education;Therapeutic exercise    Consulted and Agree with Plan of Care Patient             Patient will benefit from skilled therapeutic intervention in order to improve the following deficits and impairments:   Body Structure / Function / Physical Skills: ADL, Edema, Dexterity, Decreased knowledge of precautions, Flexibility, ROM, UE functional use, Scar mobility, Sensation, Pain, Strength, IADL       Visit Diagnosis: Pain in right hand  Pain in right wrist  Stiffness of right wrist, not elsewhere classified  Muscle weakness (generalized)  Stiffness of right hand, not elsewhere classified    Problem List Patient Active Problem List   Diagnosis Date Noted   Dizziness 01/13/2022   Bilateral hand numbness 01/13/2022   Primary osteoarthritis of both hands 10/15/2021   Restless leg syndrome 10/05/2021   History of lacunar cerebrovascular accident (CVA) 02/10/2021   Smoker 12/13/2020   Primary hypertension 12/13/2020   COPD (chronic obstructive pulmonary disease) (HCC) 12/13/2020   GERD (gastroesophageal reflux disease) 12/13/2020  Oletta Cohn, OTR/L,CLT 10/20/2022, 8:35 PM  [place] [facility] [address] [place], [address] Phone: [phone]   Fax:  [phone]  Name: GYASI HAZZARD MRN: [id number] Date of Birth: 1965/07/29

## 2022-10-28 ENCOUNTER — Ambulatory Visit: Payer: BC Managed Care – PPO | Admitting: Occupational Therapy

## 2022-11-03 ENCOUNTER — Ambulatory Visit: Payer: BC Managed Care – PPO | Admitting: Occupational Therapy

## 2022-11-03 DIAGNOSIS — M25631 Stiffness of right wrist, not elsewhere classified: Secondary | ICD-10-CM | POA: Diagnosis not present

## 2022-11-03 DIAGNOSIS — M79641 Pain in right hand: Secondary | ICD-10-CM | POA: Diagnosis not present

## 2022-11-03 DIAGNOSIS — M25531 Pain in right wrist: Secondary | ICD-10-CM

## 2022-11-03 DIAGNOSIS — M6281 Muscle weakness (generalized): Secondary | ICD-10-CM | POA: Diagnosis not present

## 2022-11-03 DIAGNOSIS — M25641 Stiffness of right hand, not elsewhere classified: Secondary | ICD-10-CM

## 2022-11-03 NOTE — Therapy (Signed)
[facility] [address], [place], [address] Phone: [phone]   Fax:  [phone]  Occupational Therapy Treatment  Patient Details  Name: Justin Olsen MRN: [id number] Date of Birth: 11-Jan-1966 Referring Provider (OT): Cranston Neighbor PA   Encounter Date: 11/03/2022   OT End of Session - 11/03/22 1446     Visit Number 6    Number of Visits 16    Date for OT Re-Evaluation 11/28/22    OT Start Time 1446    OT Stop Time 1530    OT Time Calculation (min) 44 min    Activity Tolerance Patient tolerated treatment well    Behavior During Therapy [facility] for tasks assessed/performed             Past Medical History:  Diagnosis Date   Aortic atherosclerosis (HCC)    CAD (coronary artery disease)    Emphysema lung (HCC)    Essential hypertension    GERD (gastroesophageal reflux disease)    Headache    Wears dentures    full upper and lower    Past Surgical History:  Procedure Laterality Date   CARPOMETACARPAL (CMC) FUSION OF THUMB Right 09/06/2022   Procedure: Right thumb carpometacarpal arthroplasty;  Surgeon: Kennedy Bucker, MD;  Location: [facility];  Service: Orthopedics;  Laterality: Right;    There were no vitals filed for this visit.   Subjective Assessment - 11/03/22 1446     Subjective  I seen the surgeon - doing okay - booked out until next month- wearing my hard splint less than 25%- pinching, gripping , lifting and pushing    Pertinent History 09/20/22 Ortho note - 09/06/22  R thumb CMC arthroplastly byg DR Rosita Kea - Impression: Osteoarthritis of carpometacarpal (CMC) joint of both thumbs (M18.0) Status post [facility] arthroplasty right (primary encounter diagnosis)  - sutures removed and sterri strips place - Plan: 1. Continue with thumb spica splint at night for 1 week 2. In 1 week wear thumb spica splint only at night, for 3 additional weeks 3. Oxycodone refilled 4. Prescription for hand therapy given. He  will start next week 5. Follow-up with Dr. Rosita Kea in 4 weeks    Patient Stated Goals Want to get my pain better in my motion and strength in my right thumb and hand so I can do things around the house.  Worked in the yard, hunt, take care my garden and fish    Currently in Pain? No/denies                [facility] OT Assessment - 11/03/22 0001       AROM   Right Wrist Extension 68 Degrees    Right Wrist Flexion 80 Degrees    Right Wrist Radial Deviation 20 Degrees    Right Wrist Ulnar Deviation 30 Degrees      Strength   Right Hand Grip (lbs) 42    Right Hand Lateral Pinch 5 lbs    Right Hand 3 Point Pinch 9 lbs    Left Hand Lateral Pinch 20 lbs    Left Hand 3 Point Pinch 15 lbs             Pt arrive with no pain - report over use it at times Did see surgeon - out until 12/12/22 Arrive with thumb spica on          OT Treatments/Exercises (OP) - 11/03/22 0001       RUE Fluidotherapy  Number Minutes Fluidotherapy 8 Minutes    RUE Fluidotherapy Location Hand;Wrist    Comments decreas estiffnes and pian             Soft tissue mobs to webspace and MC spreads - volar palm and wrist prior to AROM and AAROM for wrist in all planes  Scar massage and xtractor done this date with thumb AROM in all planes Strength increasing in wrist, thumb and grip/prehension -see flowsheet   Tendon glides  Thumb PA and RA  Opposition - 12 reps all after heat  Rubber band for PA and RA of thumb 12 reps Teal med putty for grip and lat and 3 point  12 reps  2 x day -if no pain in 3 days  Increase to 2nd set  Keep pain under 2/10 Cont with 1 lbs or 16oz hammer for wrist and forearm in all planes          OT Education - 11/03/22 1446     Education Details progress and changes to HEP    Person(s) Educated Patient    Methods Explanation;Demonstration;Tactile cues;Verbal cues;Handout    Comprehension Verbal cues required;Verbalized understanding;Returned demonstration               OT Short Term Goals - 10/03/22 1355       OT SHORT TERM GOAL #1   Title Patient to be independent in home program to decrease pain and edema to less than 2/10 to increase active range of motion in thumb and wrist to initiate weaning of splint.    Baseline Patient arrived with pain 4/10 at the best and 7/10 with any range of motion or splint off.  Patient limited in thumb and wrist active range of motion as well as composite fist.  Patient to wear thumb spica for the next 48 hours most of the time.  Upon level patient reported last few days wearing splint more than 75% of time    Time 2    Period Weeks    Status New    Target Date 10/17/22               OT Long Term Goals - 10/03/22 1356       OT LONG TERM GOAL #1   Title Right thumb x-ray range of motion improved within functional limits to grasp a glass and do buttons without any symptoms.    Baseline Pain at rest 7/10 with active range of motion.  Limited in opposition pain to fourth digit.  MC flexion 25-IP flexion 30.  Palmar abduction 30 degrees radial abduction 38 pain with adduction of thumb.    Time 5    Period Weeks    Status New    Target Date 11/07/22      OT LONG TERM GOAL #2   Title Right wrist active range of motion improved to within normal limits with no increase symptoms for patient to initiate strengthening as well as push and pull heavy door.    Baseline Wrist flexion with 50 extension 40.  Ulnar deviation to and and radial deviation 8 pain 7/10 radial wrist.  Thumb spica splint on more than 75% of the time.  Pain with any functional use.    Time 8    Period Weeks    Status New    Target Date 11/28/22      OT LONG TERM GOAL #3   Title Right grip and prehension strength increased to within normal limits for patient's age range  to hold the plate, do buttons in the groceries more than at a gallon without increase symptoms    Baseline Grip and prehension not tested-patient 4 weeks.  Resting pain  for 4/10 and with any active range of motion or attempt or without brace increased to 7/10.  Increase edema    Time 8    Period Weeks    Status New    Target Date 11/28/22                   Plan - 11/03/22 1446     Clinical Impression Statement Pt present at OT eval with diagnosis of R thumb CMC arthoplasty on 09/06/22 - pt  now 8 wks s/p.   At eval pain was 7/10 pain - recommend for pt to wear splint on and off 2 hrs at time -because he is active and in garden and yard during day.  Patient shows increase active range of motion for wrist and thumb in all planes.  Pt coming in with decrease  pain  and cont to increase strengthening for HEP - for PA and RA and putty for grip and prehension. Cont with  wrist 1 lbs.  Patient to keep pain under 2/10 pain.  Patient fitted in [facility] neoprene splint to use and wean into out of thumb spica. Patient limited in functional use of right dominant hand in ADL's and IADL's..  Patient can benefit from skilled OT services to decrease pain and edema, decrease scar tissue and increase motion and strength 10 to prior level of function.    OT Occupational Profile and History Problem Focused Assessment - Including review of records relating to presenting problem    Occupational performance deficits (Please refer to evaluation for details): ADL's;IADL's;Work;Play;Leisure    Body Structure / Function / Physical Skills ADL;Edema;Dexterity;Decreased knowledge of precautions;Flexibility;ROM;UE functional use;Scar mobility;Sensation;Pain;Strength;IADL    Rehab Potential Good    Clinical Decision Making Limited treatment options, no task modification necessary    Comorbidities Affecting Occupational Performance: None    Modification or Assistance to Complete Evaluation  No modification of tasks or assist necessary to complete eval    OT Frequency 2x / week    OT Duration 8 weeks    OT Treatment/Interventions Self-care/ADL training;Contrast Bath;Manual Therapy;Passive  range of motion;Scar mobilization;Fluidtherapy;Paraffin;Splinting;Patient/family education;Therapeutic exercise    Consulted and Agree with Plan of Care Patient             Patient will benefit from skilled therapeutic intervention in order to improve the following deficits and impairments:   Body Structure / Function / Physical Skills: ADL, Edema, Dexterity, Decreased knowledge of precautions, Flexibility, ROM, UE functional use, Scar mobility, Sensation, Pain, Strength, IADL       Visit Diagnosis: Pain in right hand  Pain in right wrist  Muscle weakness (generalized)  Stiffness of right hand, not elsewhere classified  Stiffness of right wrist, not elsewhere classified    Problem List Patient Active Problem List   Diagnosis Date Noted   Dizziness 01/13/2022   Bilateral hand numbness 01/13/2022   Primary osteoarthritis of both hands 10/15/2021   Restless leg syndrome 10/05/2021   History of lacunar cerebrovascular accident (CVA) 02/10/2021   Smoker 12/13/2020   Primary hypertension 12/13/2020   COPD (chronic obstructive pulmonary disease) (HCC) 12/13/2020   GERD (gastroesophageal reflux disease) 12/13/2020    Oletta Cohn, OTR/L,CLT 11/03/2022, 5:06 PM  [facility] [address], [place], [address] Phone: [phone]  Fax:  [phone]  Name: Justin Olsen MRN: [id number] Date of Birth: December 18, 1965

## 2022-11-10 ENCOUNTER — Ambulatory Visit: Payer: BC Managed Care – PPO | Admitting: Occupational Therapy

## 2022-11-10 DIAGNOSIS — M25631 Stiffness of right wrist, not elsewhere classified: Secondary | ICD-10-CM | POA: Diagnosis not present

## 2022-11-10 DIAGNOSIS — M25531 Pain in right wrist: Secondary | ICD-10-CM

## 2022-11-10 DIAGNOSIS — M25641 Stiffness of right hand, not elsewhere classified: Secondary | ICD-10-CM | POA: Diagnosis not present

## 2022-11-10 DIAGNOSIS — M79641 Pain in right hand: Secondary | ICD-10-CM | POA: Diagnosis not present

## 2022-11-10 DIAGNOSIS — M6281 Muscle weakness (generalized): Secondary | ICD-10-CM | POA: Diagnosis not present

## 2022-11-10 NOTE — Therapy (Signed)
[facility] [address], [place], [address] Phone: [phone]   Fax:  [phone]  Occupational Therapy Treatment  Patient Details  Name: Justin Olsen MRN: [id number] Date of Birth: February 04, 1966 Referring Provider (OT): Cranston Neighbor PA   Encounter Date: 11/10/2022   OT End of Session - 11/10/22 2139     Visit Number 7    Number of Visits 16    Date for OT Re-Evaluation 11/28/22    OT Start Time 1209    OT Stop Time 1245    OT Time Calculation (min) 36 min    Activity Tolerance Patient tolerated treatment well    Behavior During Therapy [facility] for tasks assessed/performed             Past Medical History:  Diagnosis Date   Aortic atherosclerosis (HCC)    CAD (coronary artery disease)    Emphysema lung (HCC)    Essential hypertension    GERD (gastroesophageal reflux disease)    Headache    Wears dentures    full upper and lower    Past Surgical History:  Procedure Laterality Date   CARPOMETACARPAL (CMC) FUSION OF THUMB Right 09/06/2022   Procedure: Right thumb carpometacarpal arthroplasty;  Surgeon: Kennedy Bucker, MD;  Location: [facility];  Service: Orthopedics;  Laterality: Right;    There were no vitals filed for this visit.   Subjective Assessment - 11/10/22 2138     Subjective  No pain - at the most when I use it the day a lot about 3/10 - using it more than doing exercises because of helping my parents and doings things around the house -the soft black splint helps    Pertinent History 09/20/22 Ortho note - 09/06/22  R thumb CMC arthroplastly byg DR Rosita Kea - Impression: Osteoarthritis of carpometacarpal (CMC) joint of both thumbs (M18.0) Status post [facility] arthroplasty right (primary encounter diagnosis)  - sutures removed and sterri strips place - Plan: 1. Continue with thumb spica splint at night for 1 week 2. In 1 week wear thumb spica splint only at night, for 3 additional weeks 3. Oxycodone  refilled 4. Prescription for hand therapy given. He will start next week 5. Follow-up with Dr. Rosita Kea in 4 weeks    Patient Stated Goals Want to get my pain better in my motion and strength in my right thumb and hand so I can do things around the house.  Worked in the yard, hunt, take care my garden and fish    Currently in Pain? No/denies                [facility] OT Assessment - 11/10/22 0001       AROM   Right Wrist Extension 65 Degrees    Right Wrist Flexion 80 Degrees    Right Wrist Radial Deviation 20 Degrees    Right Wrist Ulnar Deviation 30 Degrees      Strength   Right Hand Grip (lbs) 61    Right Hand Lateral Pinch 10 lbs    Right Hand 3 Point Pinch 13 lbs    Left Hand Grip (lbs) 90    Left Hand Lateral Pinch 20 lbs    Left Hand 3 Point Pinch 15 lbs             Pt cont to show increase strength in grip and prehension  As well as thumb PA and RA  Some tightness and pain for wrist ext and flexion  Pain improving = 3/10 the most the last few days          OT Treatments/Exercises (OP) - 11/10/22 0001       RUE Fluidotherapy   Number Minutes Fluidotherapy 8 Minutes    RUE Fluidotherapy Location Hand;Wrist    Comments decrease stiffness and pain            Soft tissue mobs to webspace and MC spreads - volar palm and wrist prior to AROM and AAROM for wrist in all planes  As well as joint mobs to wrist and carpal rolls for light weightbearing Scar massage and xtractor done this date with thumb AROM in all planes      Tendon glides  Thumb PA and RA  Opposition - 12 reps all after heat  Rubber band for PA and RA of thumb 12 reps- pt to do 3 sets 2 x day pain free Cont with Teal med putty for grip and lat and 3 point - 2-3 sets pain free 12 reps  2 x day  Keep pain under 2/10             OT Education - 11/10/22 2139     Education Details progress and changes to HEP    Person(s) Educated Patient    Methods Explanation;Demonstration;Tactile  cues;Verbal cues;Handout    Comprehension Verbal cues required;Verbalized understanding;Returned demonstration              OT Short Term Goals - 10/03/22 1355       OT SHORT TERM GOAL #1   Title Patient to be independent in home program to decrease pain and edema to less than 2/10 to increase active range of motion in thumb and wrist to initiate weaning of splint.    Baseline Patient arrived with pain 4/10 at the best and 7/10 with any range of motion or splint off.  Patient limited in thumb and wrist active range of motion as well as composite fist.  Patient to wear thumb spica for the next 48 hours most of the time.  Upon level patient reported last few days wearing splint more than 75% of time    Time 2    Period Weeks    Status New    Target Date 10/17/22               OT Long Term Goals - 10/03/22 1356       OT LONG TERM GOAL #1   Title Right thumb x-ray range of motion improved within functional limits to grasp a glass and do buttons without any symptoms.    Baseline Pain at rest 7/10 with active range of motion.  Limited in opposition pain to fourth digit.  MC flexion 25-IP flexion 30.  Palmar abduction 30 degrees radial abduction 38 pain with adduction of thumb.    Time 5    Period Weeks    Status New    Target Date 11/07/22      OT LONG TERM GOAL #2   Title Right wrist active range of motion improved to within normal limits with no increase symptoms for patient to initiate strengthening as well as push and pull heavy door.    Baseline Wrist flexion with 50 extension 40.  Ulnar deviation to and and radial deviation 8 pain 7/10 radial wrist.  Thumb spica splint on more than 75% of the time.  Pain with any functional use.    Time 8    Period Weeks  Status New    Target Date 11/28/22      OT LONG TERM GOAL #3   Title Right grip and prehension strength increased to within normal limits for patient's age range to hold the plate, do buttons in the groceries more  than at a gallon without increase symptoms    Baseline Grip and prehension not tested-patient 4 weeks.  Resting pain for 4/10 and with any active range of motion or attempt or without brace increased to 7/10.  Increase edema    Time 8    Period Weeks    Status New    Target Date 11/28/22                   Plan - 11/10/22 2140     Clinical Impression Statement Pt present at OT eval with diagnosis of R thumb CMC arthoplasty on 09/06/22 - pt  now 9 1/2 wks s/p.   At eval pain was 7/10 pain - pt to use hard splint when he is active and in garden and yard during day.  Patient shows decrease pain and  increase AROM  for wrist and thumb in all planes.  Show increase strength in wrist and forearm as well as grip and prehension strength. Pt cont with CMC neoprene splint to use and wean into out of thumb spica. Cont soft tissue and PROM for wrist flexion and extention as well as strengthening for thumb PA and RA. Patient limited in functional use of right dominant hand in ADL's and IADL's..  Patient can benefit from skilled OT services to decrease pain and edema, decrease scar tissue and increase motion and strength to prior level of function.    OT Occupational Profile and History Problem Focused Assessment - Including review of records relating to presenting problem    Occupational performance deficits (Please refer to evaluation for details): ADL's;IADL's;Work;Play;Leisure    Body Structure / Function / Physical Skills ADL;Edema;Dexterity;Decreased knowledge of precautions;Flexibility;ROM;UE functional use;Scar mobility;Sensation;Pain;Strength;IADL    Rehab Potential Good    Clinical Decision Making Limited treatment options, no task modification necessary    Comorbidities Affecting Occupational Performance: None    Modification or Assistance to Complete Evaluation  No modification of tasks or assist necessary to complete eval    OT Frequency 2x / week    OT Duration 8 weeks    OT  Treatment/Interventions Self-care/ADL training;Contrast Bath;Manual Therapy;Passive range of motion;Scar mobilization;Fluidtherapy;Paraffin;Splinting;Patient/family education;Therapeutic exercise    Consulted and Agree with Plan of Care Patient             Patient will benefit from skilled therapeutic intervention in order to improve the following deficits and impairments:   Body Structure / Function / Physical Skills: ADL, Edema, Dexterity, Decreased knowledge of precautions, Flexibility, ROM, UE functional use, Scar mobility, Sensation, Pain, Strength, IADL       Visit Diagnosis: Pain in right hand  Pain in right wrist  Muscle weakness (generalized)  Stiffness of right hand, not elsewhere classified  Stiffness of right wrist, not elsewhere classified    Problem List Patient Active Problem List   Diagnosis Date Noted   Dizziness 01/13/2022   Bilateral hand numbness 01/13/2022   Primary osteoarthritis of both hands 10/15/2021   Restless leg syndrome 10/05/2021   History of lacunar cerebrovascular accident (CVA) 02/10/2021   Smoker 12/13/2020   Primary hypertension 12/13/2020   COPD (chronic obstructive pulmonary disease) (HCC) 12/13/2020   GERD (gastroesophageal reflux disease) 12/13/2020    Oletta Cohn, OTR/L,CLT 11/10/2022, 9:45  PM  [facility] [address], [place], [address] Phone: [phone]   Fax:  [phone]  Name: Justin Olsen MRN: [id number] Date of Birth: 09-Jul-1965

## 2022-11-17 ENCOUNTER — Ambulatory Visit: Payer: BC Managed Care – PPO | Admitting: Occupational Therapy

## 2022-11-17 DIAGNOSIS — M6281 Muscle weakness (generalized): Secondary | ICD-10-CM

## 2022-11-17 DIAGNOSIS — M25631 Stiffness of right wrist, not elsewhere classified: Secondary | ICD-10-CM

## 2022-11-17 DIAGNOSIS — M25641 Stiffness of right hand, not elsewhere classified: Secondary | ICD-10-CM

## 2022-11-17 DIAGNOSIS — M25531 Pain in right wrist: Secondary | ICD-10-CM

## 2022-11-17 DIAGNOSIS — M79641 Pain in right hand: Secondary | ICD-10-CM | POA: Diagnosis not present

## 2022-11-17 NOTE — Therapy (Signed)
[facility] [address], [place], [address] Phone: [phone]   Fax:  [phone]  Occupational Therapy Treatment  Patient Details  Name: Justin Olsen MRN: [id number] Date of Birth: 10-30-1965 Referring Provider (OT): Cranston Neighbor PA   Encounter Date: 11/17/2022   OT End of Session - 11/17/22 1512     Visit Number 8    Number of Visits 16    Date for OT Re-Evaluation 11/28/22    OT Start Time 1458    OT Stop Time 1540    OT Time Calculation (min) 42 min    Activity Tolerance Patient tolerated treatment well    Behavior During Therapy [facility] for tasks assessed/performed             Past Medical History:  Diagnosis Date   Aortic atherosclerosis (HCC)    CAD (coronary artery disease)    Emphysema lung (HCC)    Essential hypertension    GERD (gastroesophageal reflux disease)    Headache    Wears dentures    full upper and lower    Past Surgical History:  Procedure Laterality Date   CARPOMETACARPAL (CMC) FUSION OF THUMB Right 09/06/2022   Procedure: Right thumb carpometacarpal arthroplasty;  Surgeon: Kennedy Bucker, MD;  Location: [facility];  Service: Orthopedics;  Laterality: Right;    There were no vitals filed for this visit.   Subjective Assessment - 11/17/22 1510     Subjective  Wrist bother me more than the thumb- still some tingling in thumb -still hard to open cans  or bottles- doing groceries - just not to much weight- bathing and dressing okay    Pertinent History 09/20/22 Ortho note - 09/06/22  R thumb CMC arthroplastly byg DR Rosita Kea - Impression: Osteoarthritis of carpometacarpal (CMC) joint of both thumbs (M18.0) Status post [facility] arthroplasty right (primary encounter diagnosis)  - sutures removed and sterri strips place - Plan: 1. Continue with thumb spica splint at night for 1 week 2. In 1 week wear thumb spica splint only at night, for 3 additional weeks 3. Oxycodone refilled 4.  Prescription for hand therapy given. He will start next week 5. Follow-up with Dr. Rosita Kea in 4 weeks    Patient Stated Goals Want to get my pain better in my motion and strength in my right thumb and hand so I can do things around the house.  Worked in the yard, hunt, take care my garden and fish    Currently in Pain? No/denies                [facility] OT Assessment - 11/17/22 0001       AROM   Right Wrist Extension 60 Degrees    Right Wrist Flexion 75 Degrees    Right Wrist Radial Deviation 20 Degrees    Right Wrist Ulnar Deviation 30 Degrees      Strength   Right Hand Grip (lbs) 45    Right Hand Lateral Pinch 8 lbs    Right Hand 3 Point Pinch 9 lbs    Left Hand Grip (lbs) 90    Left Hand Lateral Pinch 20 lbs    Left Hand 3 Point Pinch 15 lbs      Right Hand AROM   R Thumb Radial ABduction/ADduction 0-55 60    R Thumb Palmar ABduction/ADduction 0-45 55    R Thumb Opposition to Index --   Opposition to 2nd fold of 5th  Pt arrive with [facility] neoprene splint -report increase use but still pain at times - wrist more than thumb 'and thumb IP /MC more than CMC Pt assisting mom a lot the last few wks at home and with MD appt- driving    OT Treatments/Exercises (OP) - 11/17/22 0001       RUE Fluidotherapy   Number Minutes Fluidotherapy 8 Minutes    RUE Fluidotherapy Location Hand;Wrist    Comments decrease stiffness and pain            Soft tissue mobs to webspace and MC spreads - volar palm and wrist prior to AROM and AAROM for wrist in all planes  As well as joint mobs to wrist and carpal rolls for light weightbearing Increase wrist ext and flexion     2 lbs for wrist in all planes 12 reps pain free   AAROM Thumb PA and RA  Opposition - 12 reps  Rubber band for PA and RA of thumb 12 reps- pt to do 3 sets 2 x day pain free Cont with Teal med putty for grip and lat and 3 point - 2 sets pain free Can use CMC neoprene for prehension 12 reps  2 x  day  Keep pain under 2/10         OT Education - 11/17/22 1512     Education Details progress and changes to HEP    Person(s) Educated Patient    Methods Explanation;Demonstration;Tactile cues;Verbal cues;Handout    Comprehension Verbal cues required;Verbalized understanding;Returned demonstration              OT Short Term Goals - 10/03/22 1355       OT SHORT TERM GOAL #1   Title Patient to be independent in home program to decrease pain and edema to less than 2/10 to increase active range of motion in thumb and wrist to initiate weaning of splint.    Baseline Patient arrived with pain 4/10 at the best and 7/10 with any range of motion or splint off.  Patient limited in thumb and wrist active range of motion as well as composite fist.  Patient to wear thumb spica for the next 48 hours most of the time.  Upon level patient reported last few days wearing splint more than 75% of time    Time 2    Period Weeks    Status New    Target Date 10/17/22               OT Long Term Goals - 10/03/22 1356       OT LONG TERM GOAL #1   Title Right thumb x-ray range of motion improved within functional limits to grasp a glass and do buttons without any symptoms.    Baseline Pain at rest 7/10 with active range of motion.  Limited in opposition pain to fourth digit.  MC flexion 25-IP flexion 30.  Palmar abduction 30 degrees radial abduction 38 pain with adduction of thumb.    Time 5    Period Weeks    Status New    Target Date 11/07/22      OT LONG TERM GOAL #2   Title Right wrist active range of motion improved to within normal limits with no increase symptoms for patient to initiate strengthening as well as push and pull heavy door.    Baseline Wrist flexion with 50 extension 40.  Ulnar deviation to and and radial deviation 8 pain 7/10 radial wrist.  Thumb spica  splint on more than 75% of the time.  Pain with any functional use.    Time 8    Period Weeks    Status New     Target Date 11/28/22      OT LONG TERM GOAL #3   Title Right grip and prehension strength increased to within normal limits for patient's age range to hold the plate, do buttons in the groceries more than at a gallon without increase symptoms    Baseline Grip and prehension not tested-patient 4 weeks.  Resting pain for 4/10 and with any active range of motion or attempt or without brace increased to 7/10.  Increase edema    Time 8    Period Weeks    Status New    Target Date 11/28/22                   Plan - 11/17/22 1513     Clinical Impression Statement Pt present at OT eval with diagnosis of R thumb CMC arthoplasty on 09/06/22 - pt  now 10 wks s/p.   At eval pain was 7/10 pain - pt to use hard splint when he is active and in garden and yard during day.  Patient shows increase AROM and strength with decrease pain - cont to have  decrease ROM and pain with wrist extention and flexion. This date grip and prehension strength decrease- Review with pt wrist PROM and AAROM - strengthing using 2 lbs weight pain free -and cont with med teal putty with grip and prehension strength. Pt cont with CMC neoprene splint to use and wean into out of thumb spica. Cont with strengthening for thumb PA and RA. Patient limited in functional use of right dominant hand in IADL's and house and yard work.  Patient can benefit from skilled OT services to decrease pain  and increase motion and strength to prior level of function.    OT Occupational Profile and History Problem Focused Assessment - Including review of records relating to presenting problem    Occupational performance deficits (Please refer to evaluation for details): ADL's;IADL's;Work;Play;Leisure    Body Structure / Function / Physical Skills ADL;Edema;Dexterity;Decreased knowledge of precautions;Flexibility;ROM;UE functional use;Scar mobility;Sensation;Pain;Strength;IADL    Rehab Potential Good    Clinical Decision Making Limited treatment options,  no task modification necessary    Comorbidities Affecting Occupational Performance: None    Modification or Assistance to Complete Evaluation  No modification of tasks or assist necessary to complete eval    OT Frequency 1x / week    OT Duration 8 weeks    OT Treatment/Interventions Self-care/ADL training;Contrast Bath;Manual Therapy;Passive range of motion;Scar mobilization;Fluidtherapy;Paraffin;Splinting;Patient/family education;Therapeutic exercise    Consulted and Agree with Plan of Care Patient             Patient will benefit from skilled therapeutic intervention in order to improve the following deficits and impairments:   Body Structure / Function / Physical Skills: ADL, Edema, Dexterity, Decreased knowledge of precautions, Flexibility, ROM, UE functional use, Scar mobility, Sensation, Pain, Strength, IADL       Visit Diagnosis: Pain in right hand  Pain in right wrist  Muscle weakness (generalized)  Stiffness of right hand, not elsewhere classified  Stiffness of right wrist, not elsewhere classified    Problem List Patient Active Problem List   Diagnosis Date Noted   Dizziness 01/13/2022   Bilateral hand numbness 01/13/2022   Primary osteoarthritis of both hands 10/15/2021   Restless leg syndrome 10/05/2021   History  of lacunar cerebrovascular accident (CVA) 02/10/2021   Smoker 12/13/2020   Primary hypertension 12/13/2020   COPD (chronic obstructive pulmonary disease) (HCC) 12/13/2020   GERD (gastroesophageal reflux disease) 12/13/2020    Oletta Cohn, OTR/L,CLT 11/17/2022, 6:34 PM  [facility] [address] [place], [address] Phone: [phone]   Fax:  [phone]  Name: Justin Olsen MRN: [id number] Date of Birth: [birth year]

## 2022-11-23 ENCOUNTER — Other Ambulatory Visit: Payer: Self-pay | Admitting: Family Medicine

## 2022-11-23 DIAGNOSIS — I10 Essential (primary) hypertension: Secondary | ICD-10-CM

## 2022-11-23 DIAGNOSIS — G8929 Other chronic pain: Secondary | ICD-10-CM

## 2022-11-23 NOTE — Telephone Encounter (Signed)
Requested Prescriptions  Pending Prescriptions Disp Refills   diltiazem (CARDIZEM CD) 300 MG 24 hr capsule [Pharmacy Med Name: dilTIAZem 24H ER (CD) 300 MG CP] 90 capsule 0    Sig: TAKE 1 CAPSULE BY MOUTH DAILY     Cardiovascular: Calcium Channel Blockers 3 Failed - 11/23/2022  6:21 AM      Failed - Last BP in normal range    BP Readings from Last 1 Encounters:  09/06/22 (!) 148/97         Passed - ALT in normal range and within 360 days    ALT  Date Value Ref Range Status  07/28/2022 20 0 - 44 U/L Final         Passed - AST in normal range and within 360 days    AST  Date Value Ref Range Status  07/28/2022 26 15 - 41 U/L Final         Passed - Cr in normal range and within 360 days    Creatinine, Ser  Date Value Ref Range Status  08/29/2022 1.07 0.76 - 1.27 mg/dL Final         Passed - Last Heart Rate in normal range    Pulse Readings from Last 1 Encounters:  09/06/22 74         Passed - Valid encounter within last 6 months    Recent Outpatient Visits           3 months ago Primary hypertension   [facility] Malva Limes, MD   6 months ago Rhinosinusitis   [place] [place], [place], PA-C   8 months ago Primary hypertension   [facility] Malva Limes, MD   9 months ago Primary hypertension    [facility] [name], PA-C   10 months ago Dizziness   [facility] [name], PA-C       Future Appointments             In 2 days Fisher, [name], MD [facility], [facility]

## 2022-11-24 ENCOUNTER — Ambulatory Visit: Payer: BC Managed Care – PPO | Attending: Orthopedic Surgery | Admitting: Occupational Therapy

## 2022-11-24 DIAGNOSIS — M25531 Pain in right wrist: Secondary | ICD-10-CM | POA: Insufficient documentation

## 2022-11-24 DIAGNOSIS — M25631 Stiffness of right wrist, not elsewhere classified: Secondary | ICD-10-CM | POA: Diagnosis not present

## 2022-11-24 DIAGNOSIS — M79641 Pain in right hand: Secondary | ICD-10-CM | POA: Insufficient documentation

## 2022-11-24 DIAGNOSIS — M25641 Stiffness of right hand, not elsewhere classified: Secondary | ICD-10-CM | POA: Diagnosis not present

## 2022-11-24 DIAGNOSIS — M6281 Muscle weakness (generalized): Secondary | ICD-10-CM | POA: Insufficient documentation

## 2022-11-24 NOTE — Therapy (Signed)
[facility] [address], [place], [address] Phone: [phone]   Fax:  [phone]  Occupational Therapy Treatment  Patient Details  Name: Justin Olsen MRN: [id number] Date of Birth: 13-Mar-1966 Referring Provider (OT): Cranston Neighbor PA   Encounter Date: 11/24/2022   OT End of Session - 11/24/22 0853     Visit Number 9    Number of Visits 16    Date for OT Re-Evaluation 11/28/22    OT Start Time 0802    OT Stop Time 0840    OT Time Calculation (min) 38 min    Activity Tolerance Patient tolerated treatment well    Behavior During Therapy [facility] for tasks assessed/performed             Past Medical History:  Diagnosis Date   Aortic atherosclerosis (HCC)    CAD (coronary artery disease)    Emphysema lung (HCC)    Essential hypertension    GERD (gastroesophageal reflux disease)    Headache    Wears dentures    full upper and lower    Past Surgical History:  Procedure Laterality Date   CARPOMETACARPAL (CMC) FUSION OF THUMB Right 09/06/2022   Procedure: Right thumb carpometacarpal arthroplasty;  Surgeon: Kennedy Bucker, MD;  Location: [facility];  Service: Orthopedics;  Laterality: Right;    There were no vitals filed for this visit.   Subjective Assessment - 11/24/22 0852     Subjective  The pain is mostly down here from her wrist and in the distal part of my thumb.  Did see Dr. Milinda Antis keeping me out until 3 July.  Trying to use it more around the house.    Pertinent History 09/20/22 Ortho note - 09/06/22  R thumb CMC arthroplastly byg DR Rosita Kea - Impression: Osteoarthritis of carpometacarpal (CMC) joint of both thumbs (M18.0) Status post [facility] arthroplasty right (primary encounter diagnosis)  - sutures removed and sterri strips place - Plan: 1. Continue with thumb spica splint at night for 1 week 2. In 1 week wear thumb spica splint only at night, for 3 additional weeks 3. Oxycodone refilled 4. Prescription  for hand therapy given. He will start next week 5. Follow-up with Dr. Rosita Kea in 4 weeks    Patient Stated Goals Want to get my pain better in my motion and strength in my right thumb and hand so I can do things around the house.  Worked in the yard, hunt, take care my garden and fish    Currently in Pain? Yes    Pain Score 2     Pain Location --   volar wrist and IP of thumb   Pain Orientation Right    Pain Descriptors / Indicators Aching                [facility] OT Assessment - 11/24/22 0001       AROM   Right Wrist Extension 60 Degrees    Right Wrist Flexion 80 Degrees    Right Wrist Radial Deviation 20 Degrees    Right Wrist Ulnar Deviation 30 Degrees      Strength   Right Hand Grip (lbs) 65    Right Hand Lateral Pinch 10 lbs    Right Hand 3 Point Pinch 12 lbs    Left Hand Grip (lbs) 90    Left Hand Lateral Pinch 20 lbs    Left Hand 3 Point Pinch 15 lbs  Pt arrive with [facility] neoprene splint -report  pain or discomfort in volar wrist and thumb MC /IP about 2/10  Seen DR Rosita Kea -out of work until 3rd July  Discomfort mostly at thumb MCP and IP as well as volar wrist   Patient was able to push up from chair as well as push and pull heavy door with pain staying about 2/10 Patient able to carry 8 to 10 pounds as well as lifting with no increased pain.     OT Treatments/Exercises (OP) - 11/24/22 0001       RUE Fluidotherapy   Number Minutes Fluidotherapy 8 Minutes    RUE Fluidotherapy Location Hand;Wrist    Comments decrease stiffness and pain             Soft tissue mobs to webspace and MC spreads - volar palm and wrist prior to AROM and AAROM for wrist in all planes  As well as joint mobs to wrist and carpal rolls for light weightbearing Increase wrist ext and flexion       AAROM Thumb PA and RA  Opposition - 12 reps  Rubber band for PA and RA of thumb 12 reps- pt to do 3 sets 2 x day pain free Upgrade to green med farm putty for grip and lat and  3 point - 2 sets pain free Can use CMC neoprene for prehension Try to keep pain under 3/10. 12 reps  2 x day  Keep pain under 2/10  Patient noted overdo putty and strengthening with the days that he is very active around the house.         OT Education - 11/24/22 [phone]     Education Details progress and changes to HEP    Person(s) Educated Patient    Methods Explanation;Demonstration;Tactile cues;Verbal cues;Handout    Comprehension Verbal cues required;Verbalized understanding;Returned demonstration              OT Short Term Goals - 10/03/22 1355       OT SHORT TERM GOAL #1   Title Patient to be independent in home program to decrease pain and edema to less than 2/10 to increase active range of motion in thumb and wrist to initiate weaning of splint.    Baseline Patient arrived with pain 4/10 at the best and 7/10 with any range of motion or splint off.  Patient limited in thumb and wrist active range of motion as well as composite fist.  Patient to wear thumb spica for the next 48 hours most of the time.  Upon level patient reported last few days wearing splint more than 75% of time    Time 2    Period Weeks    Status New    Target Date 10/17/22               OT Long Term Goals - 10/03/22 1356       OT LONG TERM GOAL #1   Title Right thumb x-ray range of motion improved within functional limits to grasp a glass and do buttons without any symptoms.    Baseline Pain at rest 7/10 with active range of motion.  Limited in opposition pain to fourth digit.  MC flexion 25-IP flexion 30.  Palmar abduction 30 degrees radial abduction 38 pain with adduction of thumb.    Time 5    Period Weeks    Status New    Target Date 11/07/22      OT LONG TERM GOAL #2  Title Right wrist active range of motion improved to within normal limits with no increase symptoms for patient to initiate strengthening as well as push and pull heavy door.    Baseline Wrist flexion with 50  extension 40.  Ulnar deviation to and and radial deviation 8 pain 7/10 radial wrist.  Thumb spica splint on more than 75% of the time.  Pain with any functional use.    Time 8    Period Weeks    Status New    Target Date 11/28/22      OT LONG TERM GOAL #3   Title Right grip and prehension strength increased to within normal limits for patient's age range to hold the plate, do buttons in the groceries more than at a gallon without increase symptoms    Baseline Grip and prehension not tested-patient 4 weeks.  Resting pain for 4/10 and with any active range of motion or attempt or without brace increased to 7/10.  Increase edema    Time 8    Period Weeks    Status New    Target Date 11/28/22                   Plan - 11/24/22 0853     Clinical Impression Statement Pt present at OT eval with diagnosis of R thumb CMC arthoplasty on 09/06/22 - pt  now 11 wks s/p.   At eval pain was 7/10 pain - Pt made great progress from [facility] in AROM and strength with decrease pain - cont to have  decrease ROM mostly at wrist and pain at volar wrist and thumb IP/MC.  Great progress in grip and prehension strength this date-but it fluctuate depending on what pt do at home and help his mom to DR Appt- if increase pain with use - strength decrease. Review with pt wrist PROM and AAROM - strengthing using 2 lbs weight pain free -and upgrade to green putty with grip and prehension strength. Strengthening to do the days if not using his hand as much.  Pt cont with CMC neoprene splint with prehension and activities at home to stay symptoms free.  Patient limited in functional use of right dominant hand in IADL's and house and yard work.  Patient can benefit from skilled OT services to decrease pain  and increase motion and strength to prior level of function.    OT Occupational Profile and History Problem Focused Assessment - Including review of records relating to presenting problem    Occupational performance deficits  (Please refer to evaluation for details): ADL's;IADL's;Work;Play;Leisure    Body Structure / Function / Physical Skills ADL;Edema;Dexterity;Decreased knowledge of precautions;Flexibility;ROM;UE functional use;Scar mobility;Sensation;Pain;Strength;IADL    Rehab Potential Good    Clinical Decision Making Limited treatment options, no task modification necessary    Comorbidities Affecting Occupational Performance: None    Modification or Assistance to Complete Evaluation  No modification of tasks or assist necessary to complete eval    OT Frequency 1x / week    OT Duration 8 weeks    OT Treatment/Interventions Self-care/ADL training;Contrast Bath;Manual Therapy;Passive range of motion;Scar mobilization;Fluidtherapy;Paraffin;Splinting;Patient/family education;Therapeutic exercise    Consulted and Agree with Plan of Care Patient             Patient will benefit from skilled therapeutic intervention in order to improve the following deficits and impairments:   Body Structure / Function / Physical Skills: ADL, Edema, Dexterity, Decreased knowledge of precautions, Flexibility, ROM, UE functional use, Scar mobility, Sensation, Pain, Strength,  IADL       Visit Diagnosis: Pain in right hand  Pain in right wrist  Stiffness of right hand, not elsewhere classified  Stiffness of right wrist, not elsewhere classified    Problem List Patient Active Problem List   Diagnosis Date Noted   Dizziness 01/13/2022   Bilateral hand numbness 01/13/2022   Primary osteoarthritis of both hands 10/15/2021   Restless leg syndrome 10/05/2021   History of lacunar cerebrovascular accident (CVA) 02/10/2021   Smoker 12/13/2020   Primary hypertension 12/13/2020   COPD (chronic obstructive pulmonary disease) (HCC) 12/13/2020   GERD (gastroesophageal reflux disease) 12/13/2020    Oletta Cohn, OTR/L,CLT 11/24/2022, 8:58 AM  [facility] [address], [place], [address] Phone: [phone]   Fax:  [phone]  Name: Justin Olsen MRN: [id number] Date of Birth: [birth year]

## 2022-11-25 ENCOUNTER — Ambulatory Visit (INDEPENDENT_AMBULATORY_CARE_PROVIDER_SITE_OTHER): Payer: BC Managed Care – PPO | Admitting: Family Medicine

## 2022-11-25 VITALS — BP 149/103 | HR 88 | Ht 68.0 in

## 2022-11-25 DIAGNOSIS — R519 Headache, unspecified: Secondary | ICD-10-CM

## 2022-11-25 DIAGNOSIS — I10 Essential (primary) hypertension: Secondary | ICD-10-CM

## 2022-11-25 DIAGNOSIS — Z23 Encounter for immunization: Secondary | ICD-10-CM

## 2022-11-25 DIAGNOSIS — G2581 Restless legs syndrome: Secondary | ICD-10-CM | POA: Diagnosis not present

## 2022-11-25 DIAGNOSIS — G8929 Other chronic pain: Secondary | ICD-10-CM

## 2022-11-25 MED ORDER — CLONAZEPAM 1 MG PO TABS: 0.5000 mg | ORAL_TABLET | Freq: Every day | ORAL | 5 refills | Status: DC

## 2022-11-25 MED ORDER — GABAPENTIN 600 MG PO TABS: 600.0000 mg | ORAL_TABLET | Freq: Three times a day (TID) | ORAL | 1 refills | Status: DC

## 2022-11-25 MED ORDER — GABAPENTIN 300 MG PO CAPS: 300.0000 mg | ORAL_CAPSULE | Freq: Every day | ORAL | 1 refills | Status: DC

## 2022-11-25 NOTE — Progress Notes (Signed)
I,[name],acting as a Neurosurgeon for Justin Merry, MD.,have documented all relevant documentation on the behalf of Justin Merry, MD,as directed by  Justin Merry, MD   Established patient visit   Patient: Justin Olsen   DOB: [birth year]   57 y.o. Male  MRN: [id number] Visit Date: 11/25/2022  Today's healthcare provider: [name], MD   No chief complaint on file.  Subjective    HPI  -Tobacco counseling: declined -Lung Cancer Screening:declined -Cologuard/Colonoscopy: declined Hypertension, follow-up  BP Readings from Last 3 Encounters:  09/06/22 (!) 148/97  08/29/22 138/80  08/19/22 129/84   Wt Readings from Last 3 Encounters:  09/06/22 150 lb 6.4 oz (68.2 kg)  08/29/22 155 lb 2 oz (70.4 kg)  08/19/22 160 lb (72.6 kg)     He was last seen for hypertension 3 months ago.  BP at that visit was 129/84. Management since that visit includes increase diltiazem (CARDIZEM CD) from 240 to 300 MG 24 hr capsule .  He reports excellent compliance with treatment. He is not having side effects.  Outside blood pressures are 140's/85. Symptoms: Yes chest pain No chest pressure  No palpitations No syncope  No dyspnea No orthopnea  No paroxysmal nocturnal dyspnea Yes lower extremity edema   Pertinent labs Lab Results  Component Value Date   CHOL 154 07/28/2022   HDL 49 07/28/2022   LDLCALC 71 07/28/2022   LDLDIRECT 91 07/28/2022   TRIG 169 (H) 07/28/2022   CHOLHDL 3.1 07/28/2022   Lab Results  Component Value Date   NA 140 08/29/2022   K 4.0 08/29/2022   CREATININE 1.07 08/29/2022   EGFR 82 08/29/2022   GLUCOSE 103 (H) 08/29/2022     The 10-year ASCVD risk score (Arnett DK, et al., 2019) is: 11%  ---------------------------------------------------------------------------------------------------  He continues to have frequent headaches behind right eye. He has had for years and they resolved when we first got his blood pressure under control, but have  flared back up last several months, although not as frequent as in the past. MRI 02/08/21 found no abnormalities to explain headaches, but there was thickening of right maxillary, sphenoid and ethmoid sinuses as well as old lacunar infarcts and sequela of small vessel disease. He states he does get congested, but has headaches even when nasal passages are clear.   Medications: Outpatient Medications Prior to Visit  Medication Sig   albuterol (VENTOLIN HFA) 108 (90 Base) MCG/ACT inhaler INHALE TWO PUFFS BY MOUTH EVERY 6 HOURS AS NEEDED FOR WHEEZING OR FOR SHORTNESS OF BREATH   ASPIRIN 81 PO Take by mouth daily.   Aspirin-Salicylamide-Caffeine (BC HEADACHE POWDER PO) Take by mouth 2 (two) times daily as needed.   clonazePAM (KLONOPIN) 0.5 MG tablet Take 1 tablet (0.5 mg total) by mouth at bedtime.   diltiazem (CARDIZEM CD) 300 MG 24 hr capsule TAKE 1 CAPSULE BY MOUTH DAILY   Fluticasone-Umeclidin-Vilant (TRELEGY ELLIPTA) 100-62.5-25 MCG/ACT AEPB Inhale 1 puff into the lungs daily.   gabapentin (NEURONTIN) 300 MG capsule TAKE ONE CAPSULE BY MOUTH EVERY EVENING   hydrochlorothiazide (HYDRODIURIL) 25 MG tablet TAKE 1 TABLET BY MOUTH DAILY   naproxen (NAPROSYN) 500 MG tablet TAKE 1 TABLET BY MOUTH TWICE A DAY AS NEEDED   oxyCODONE (ROXICODONE) 5 MG immediate release tablet Take 1-2 tablets (5-10 mg total) by mouth every 4 (four) hours as needed.   pantoprazole (PROTONIX) 40 MG tablet TAKE ONE TABLET BY MOUTH DAILY   potassium chloride (KLOR-CON) 10 MEQ tablet Take 1  tablet (10 mEq total) by mouth daily.   rosuvastatin (CRESTOR) 20 MG tablet TAKE 1 TABLET BY MOUTH DAILY   No facility-administered medications prior to visit.    Review of Systems     Objective    BP (!) 149/103 (BP Location: Right Arm, Patient Position: Sitting, Cuff Size: Normal)   Pulse 88   Ht 5\' 8"  (1.727 m)   SpO2 99%   BMI 22.87 kg/m    Physical Exam   General: Appearance:    Well developed, well nourished male in no  acute distress  Eyes:    PERRL, conjunctiva/corneas clear, EOM's intact       Lungs:     Clear to auscultation bilaterally, respirations unlabored  Heart:    Normal heart rate. Normal rhythm. No murmurs, rubs, or gallops.    MS:   All extremities are intact.    Neurologic:   Awake, alert, oriented x 3. No apparent focal neurological defect.         Assessment & Plan     1. Primary hypertension Had been well controlled on current regiment but running higher the last few months. He has been having more headaches around and behind right eye and sleeping poorly. Will increase gabapentin and clonazepam as below.   2. Encounter for immunization  - Tdap vaccine greater than or equal to 7yo IM  3. Restless leg syndrome Having much more difficulty sleep which is likely contributing to headaches. Increase  clonazePAM (KLONOPIN) to 1 MG tablet; Take 0.5-1 tablets (0.5-1 mg total) by mouth at bedtime.  Dispense: 30 tablet; Refill: 5  Increase gabapentin to 600mg  tablets hs.   4. Nonintractable episodic headache, unspecified headache type Expect some improvement if he is sleeping better and increased dose of gabapentin. However he did have some mucosal thickening on MRI in 2022.   If not rapidly improve he will go to [facility] for DG Sinuses Complete; Future   Future Appointments  Date Time Provider Department Center  12/06/2022  8:15 AM Oletta Cohn, OT [facility] None  03/06/2023  3:00 PM Fatoumata Albaugh, [name], MD [facility]           Justin Merry, MD  [facility] [phone] (phone) [phone] (fax)  [facility]

## 2022-11-25 NOTE — Patient Instructions (Signed)
.   Please review the attached list of medications and notify my office if there are any errors.   . Please bring all of your medications to every appointment so we can make sure that our medication list is the same as yours.   

## 2022-12-06 ENCOUNTER — Ambulatory Visit: Payer: BC Managed Care – PPO | Admitting: Occupational Therapy

## 2022-12-08 ENCOUNTER — Ambulatory Visit: Payer: BC Managed Care – PPO | Admitting: Occupational Therapy

## 2022-12-08 DIAGNOSIS — M25631 Stiffness of right wrist, not elsewhere classified: Secondary | ICD-10-CM

## 2022-12-08 DIAGNOSIS — M6281 Muscle weakness (generalized): Secondary | ICD-10-CM

## 2022-12-08 DIAGNOSIS — M25641 Stiffness of right hand, not elsewhere classified: Secondary | ICD-10-CM

## 2022-12-08 DIAGNOSIS — M25531 Pain in right wrist: Secondary | ICD-10-CM | POA: Diagnosis not present

## 2022-12-08 DIAGNOSIS — M79641 Pain in right hand: Secondary | ICD-10-CM

## 2022-12-08 NOTE — Therapy (Signed)
[facility] [address] [place], [address] Phone: [phone]   Fax:  [phone]  Occupational Therapy Treatment  Patient Details  Name: Justin Olsen MRN: [id number] Date of Birth: [birth year] Referring Provider (OT): Cranston Neighbor PA   Encounter Date: 12/08/2022   OT End of Session - 12/08/22 1112     Visit Number 10    Number of Visits 11    Date for OT Re-Evaluation 01/05/23    OT Start Time 1115    OT Stop Time 1155    OT Time Calculation (min) 40 min    Activity Tolerance Patient tolerated treatment well    Behavior During Therapy [facility] for tasks assessed/performed             Past Medical History:  Diagnosis Date   Aortic atherosclerosis (HCC)    CAD (coronary artery disease)    Emphysema lung (HCC)    Essential hypertension    GERD (gastroesophageal reflux disease)    Headache    Wears dentures    full upper and lower    Past Surgical History:  Procedure Laterality Date   CARPOMETACARPAL (CMC) FUSION OF THUMB Right 09/06/2022   Procedure: Right thumb carpometacarpal arthroplasty;  Surgeon: Kennedy Bucker, MD;  Location: [facility];  Service: Orthopedics;  Laterality: Right;    There were no vitals filed for this visit.   Subjective Assessment - 12/08/22 1112     Subjective  I am using it - not all exercises - I am every day in the garden and outside- we have 2 new puppies- thumb is more than 75% ot 90% bettter than prior to surgery- I know when to stop - wrist bothers me the most - but probably have arthritis    Pertinent History 09/20/22 Ortho note - 09/06/22  R thumb CMC arthroplastly byg DR Rosita Kea - Impression: Osteoarthritis of carpometacarpal (CMC) joint of both thumbs (M18.0) Status post [facility] arthroplasty right (primary encounter diagnosis)  - sutures removed and sterri strips place - Plan: 1. Continue with thumb spica splint at night for 1 week 2. In 1 week wear thumb spica splint  only at night, for 3 additional weeks 3. Oxycodone refilled 4. Prescription for hand therapy given. He will start next week 5. Follow-up with Dr. Rosita Kea in 4 weeks    Patient Stated Goals Want to get my pain better in my motion and strength in my right thumb and hand so I can do things around the house.  Worked in the yard, hunt, take care my garden and fish    Currently in Pain? No/denies                [facility] OT Assessment - 12/08/22 0001       AROM   Right Wrist Extension 60 Degrees    Right Wrist Flexion 75 Degrees    Right Wrist Radial Deviation 20 Degrees    Right Wrist Ulnar Deviation 30 Degrees      Strength   Right Hand Grip (lbs) 72    Right Hand Lateral Pinch 14 lbs    Right Hand 3 Point Pinch 16 lbs    Left Hand Grip (lbs) 90    Left Hand Lateral Pinch 20 lbs    Left Hand 3 Point Pinch 15 lbs      Right Hand AROM   R Thumb Radial ABduction/ADduction 0-55 60    R Thumb Palmar ABduction/ADduction 0-45 55  R Thumb Opposition to Index --   opposition to 5th - 5/5 strength             Pt report using his hand in most every thing at home- know when to stop Use it in garden, with dogs  Wear as needed thumb spica or CMC neoprene  Wrist bother him at times the most- on dorsal wrist Wrist AROM extention still decrease - in session increase to 70  Encourage pt to work on wrist extention in shower or warm water and prayer stretch  Pt report he has some arthritis in wrist and thumb MC/IP See DR Rosita Kea -out of work until 3rd July       Patient was able to push up from chair as well as push and pull heavy door with pain staying about 2/10 Patient able to carry 8 to 10 pounds as well as lifting with no increased pain.    Wrist strength in all planes 5/5  Thumb PA and RA 5/5  Opposition still decrease to full opposition to [facility] But 5/5 strength opposition to 5th  Grip and prehension cont to increase every visit Pt to see Dr Rosita Kea on 3rd July              OT Education - 12/08/22 1333     Education Details progress and changes to HEP    Person(s) Educated Patient    Methods Explanation;Demonstration;Tactile cues;Verbal cues;Handout    Comprehension Verbal cues required;Verbalized understanding;Returned demonstration              OT Short Term Goals - 12/08/22 1338       OT SHORT TERM GOAL #1   Title Patient to be independent in home program to decrease pain and edema to less than 2/10 to increase active range of motion in thumb and wrist to initiate weaning of splint.    Status Achieved               OT Long Term Goals - 12/08/22 1338       OT LONG TERM GOAL #1   Title Right thumb x-ray range of motion improved within functional limits to grasp a glass and do buttons without any symptoms.    Status Achieved      OT LONG TERM GOAL #2   Title Right wrist active range of motion improved to within normal limits with no increase symptoms for patient to initiate strengthening as well as push and pull heavy door.    Status Achieved      OT LONG TERM GOAL #3   Title Right grip and prehension strength increased to within normal limits for patient's age range to hold the plate, do buttons in the groceries more than at a gallon without increase symptoms    Status Achieved                   Plan - 12/08/22 1113     Clinical Impression Statement Pt present at OT eval with diagnosis of R thumb CMC arthoplasty on 09/06/22 - pt  now 3 months s/p.   At eval pain was 7/10 pain - Pt made great progress from [facility] in AROM and strength in R dominant hand and wrist.  Cont to have  decrease wrist extention with some pain  on dorsal wrist but strength in all planes 5/5 . Grip and prehension strength  as well as AROM in thumb in all planes and WFL.  Review with pt wrist PROM  and AAROM - wrist extention increase to 70 degrees in session. Pt can cont with CMC neoprene splint with prehension and activities at home to stay symptoms free.  Pt  using hand in most everything in ADL's and IADL's - pt know when to stop activities that bother his hand or wrist - pt can cont with HEP - appt in 2 wks with surgeon.  Pt feels like his back to 75% to 90 % using R hand.    OT Occupational Profile and History Problem Focused Assessment - Including review of records relating to presenting problem    Occupational performance deficits (Please refer to evaluation for details): ADL's;IADL's;Work;Play;Leisure    Body Structure / Function / Physical Skills ADL;Edema;Dexterity;Decreased knowledge of precautions;Flexibility;ROM;UE functional use;Scar mobility;Sensation;Pain;Strength;IADL    Rehab Potential Good    Clinical Decision Making Limited treatment options, no task modification necessary    Comorbidities Affecting Occupational Performance: None    Modification or Assistance to Complete Evaluation  No modification of tasks or assist necessary to complete eval    OT Frequency Monthly    OT Duration 4 weeks    OT Treatment/Interventions Self-care/ADL training;Contrast Bath;Manual Therapy;Passive range of motion;Scar mobilization;Fluidtherapy;Paraffin;Splinting;Patient/family education;Therapeutic exercise    Consulted and Agree with Plan of Care Patient             Patient will benefit from skilled therapeutic intervention in order to improve the following deficits and impairments:   Body Structure / Function / Physical Skills: ADL, Edema, Dexterity, Decreased knowledge of precautions, Flexibility, ROM, UE functional use, Scar mobility, Sensation, Pain, Strength, IADL       Visit Diagnosis: Pain in right hand  Pain in right wrist  Stiffness of right hand, not elsewhere classified  Stiffness of right wrist, not elsewhere classified  Muscle weakness (generalized)    Problem List Patient Active Problem List   Diagnosis Date Noted   Dizziness 01/13/2022   Bilateral hand numbness 01/13/2022   Primary osteoarthritis of both hands  10/15/2021   Restless leg syndrome 10/05/2021   History of lacunar cerebrovascular accident (CVA) 02/10/2021   Smoker 12/13/2020   Primary hypertension 12/13/2020   COPD (chronic obstructive pulmonary disease) (HCC) 12/13/2020   GERD (gastroesophageal reflux disease) 12/13/2020    Oletta Cohn, OTR/L,CLT 12/08/2022, 1:40 PM  [facility] [address] [place], [address] Phone: [phone]   Fax:  [phone]  Name: Justin Olsen MRN: [id number] Date of Birth: 01/05/1966

## 2022-12-21 DIAGNOSIS — M180 Bilateral primary osteoarthritis of first carpometacarpal joints: Secondary | ICD-10-CM | POA: Diagnosis not present

## 2022-12-21 DIAGNOSIS — M25531 Pain in right wrist: Secondary | ICD-10-CM | POA: Diagnosis not present

## 2022-12-27 ENCOUNTER — Other Ambulatory Visit: Payer: Self-pay | Admitting: Family Medicine

## 2022-12-27 DIAGNOSIS — R0600 Dyspnea, unspecified: Secondary | ICD-10-CM

## 2022-12-28 ENCOUNTER — Other Ambulatory Visit: Payer: Self-pay | Admitting: Family Medicine

## 2022-12-28 DIAGNOSIS — K219 Gastro-esophageal reflux disease without esophagitis: Secondary | ICD-10-CM

## 2023-01-21 ENCOUNTER — Other Ambulatory Visit: Payer: Self-pay | Admitting: Family Medicine

## 2023-01-21 IMAGING — US US ABDOMEN COMPLETE
2 series · 13 of 25 positions shown · non-contrast
Comparison: None.

CLINICAL DATA: Elevated liver enzymes.  Nausea.

EXAM:
ABDOMEN ULTRASOUND COMPLETE

[Series 1: us abdomen complete · 104 acquisitions, 12 frames shown]
[im 1/104]
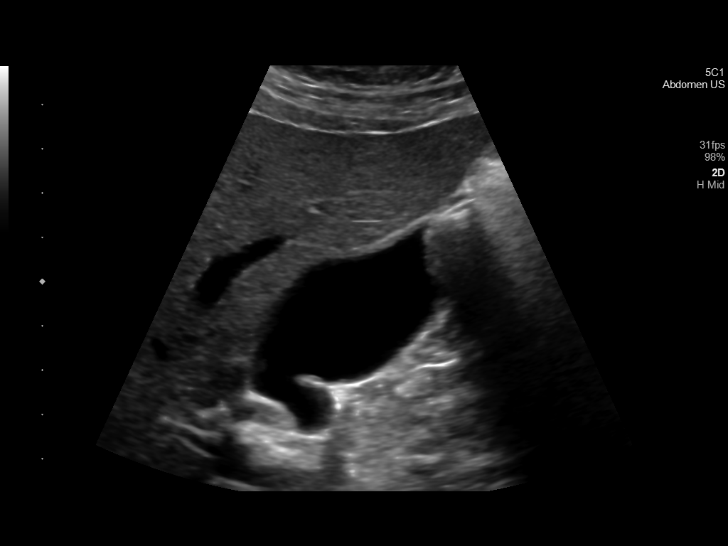
[im 9/104]
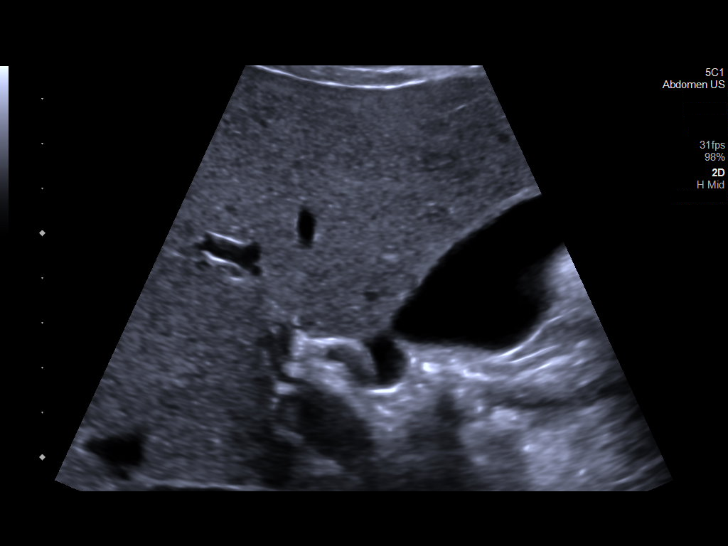
[im 18/104]
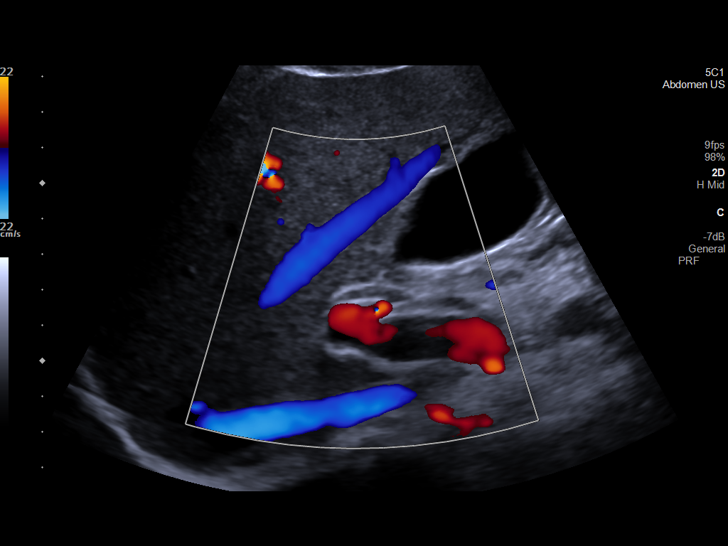
[im 27/104]
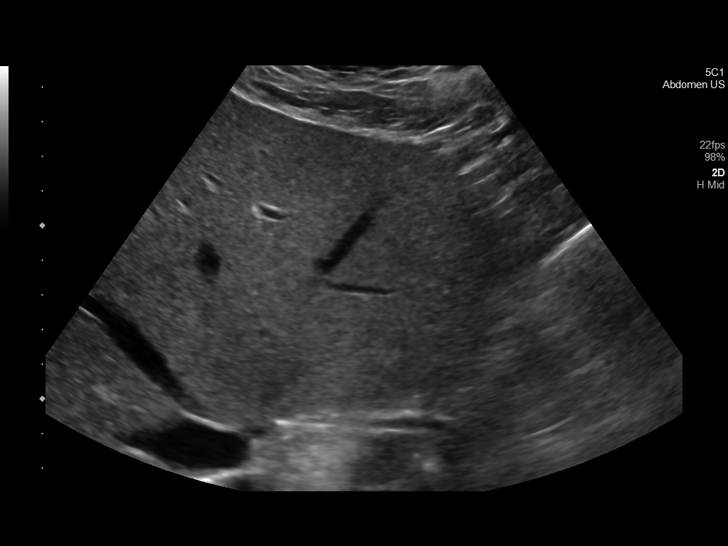
[im 36/104]
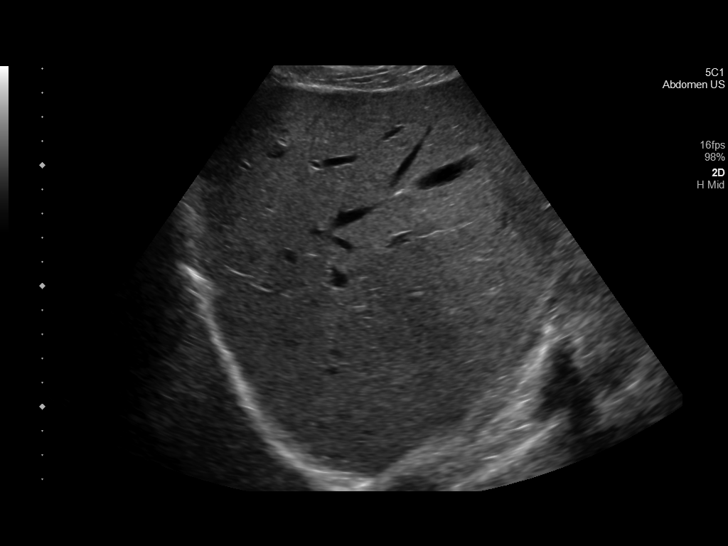
[im 45/104]
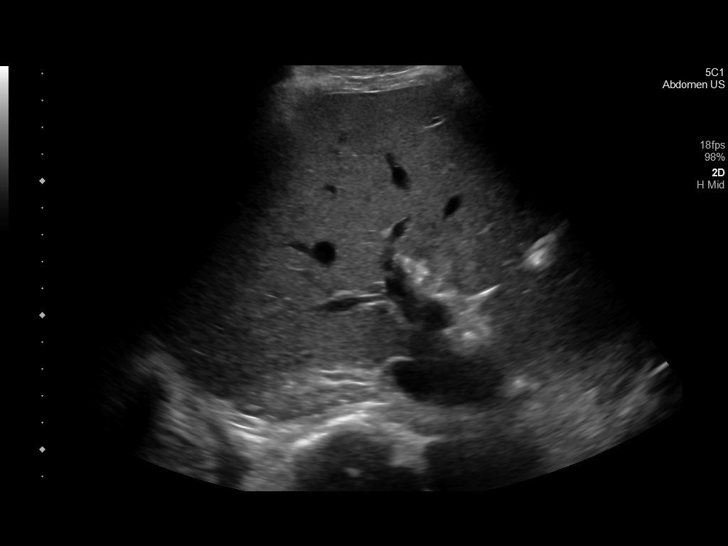
[im 54/104]
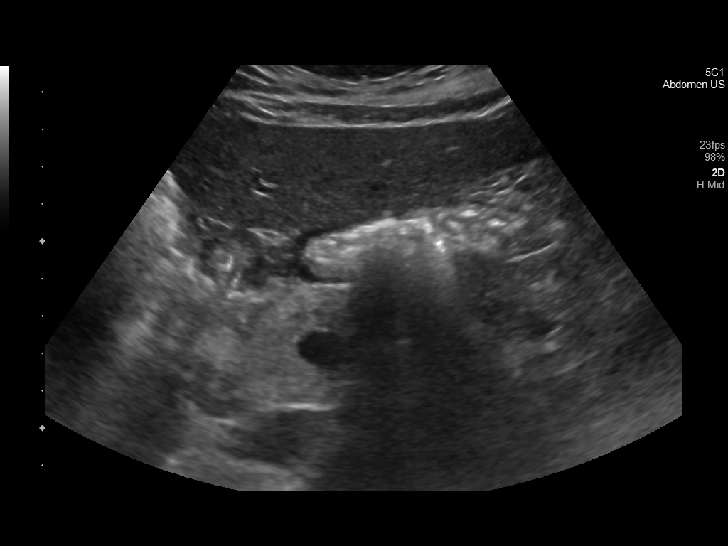
[im 63/104]
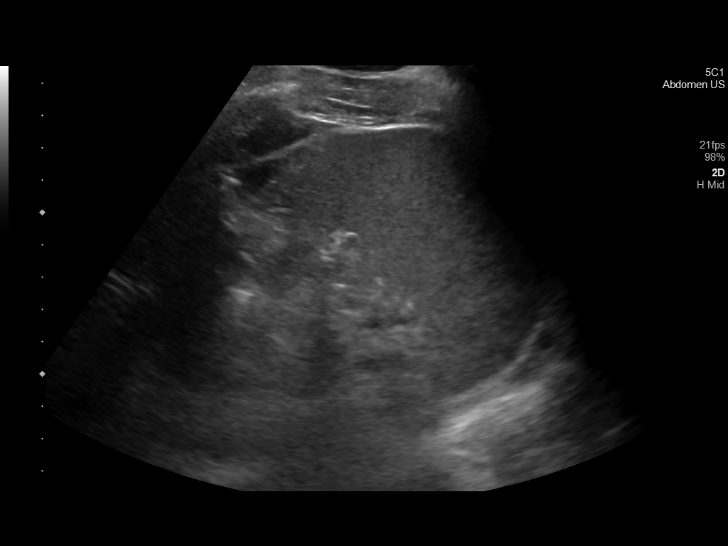
[im 72/104]
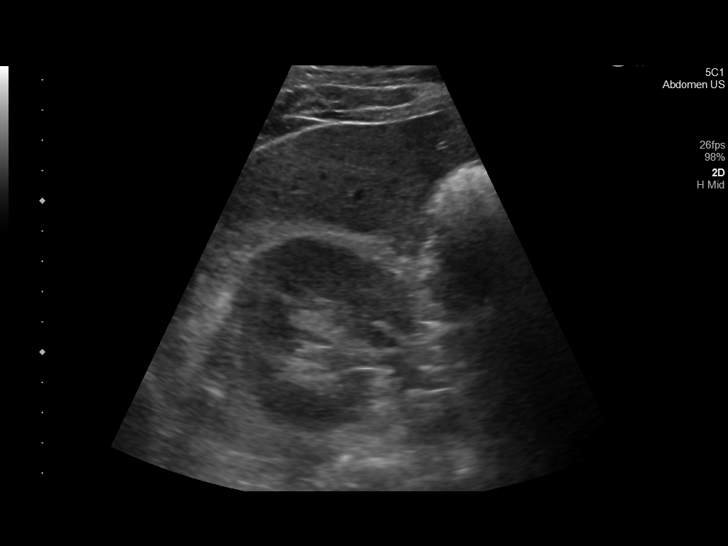
[im 81/104]
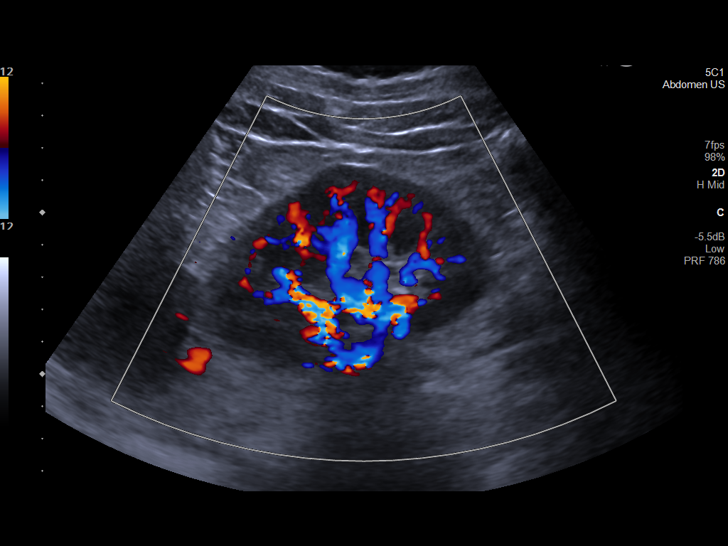
[im 90/104]
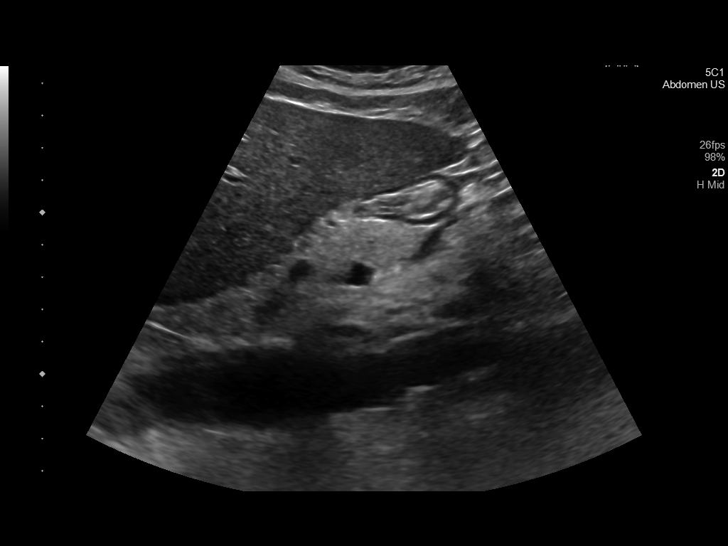
[im 99/104]
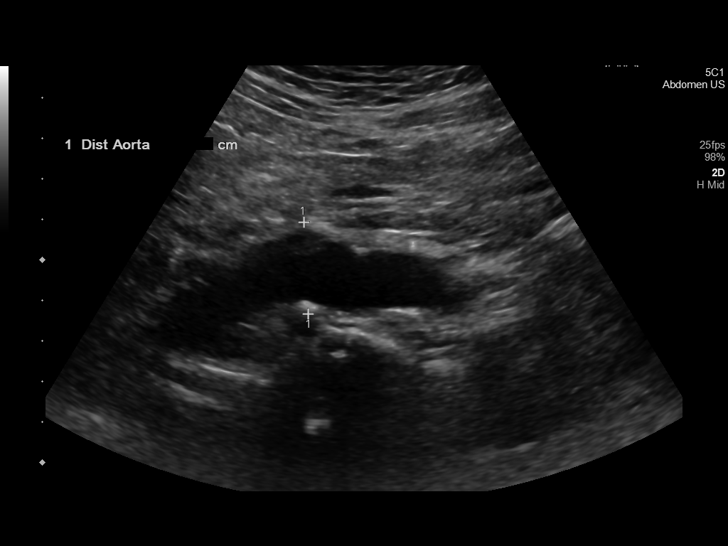

[Series 1001: abdomen us · 1 of 2 slices shown]
[im 1/2]
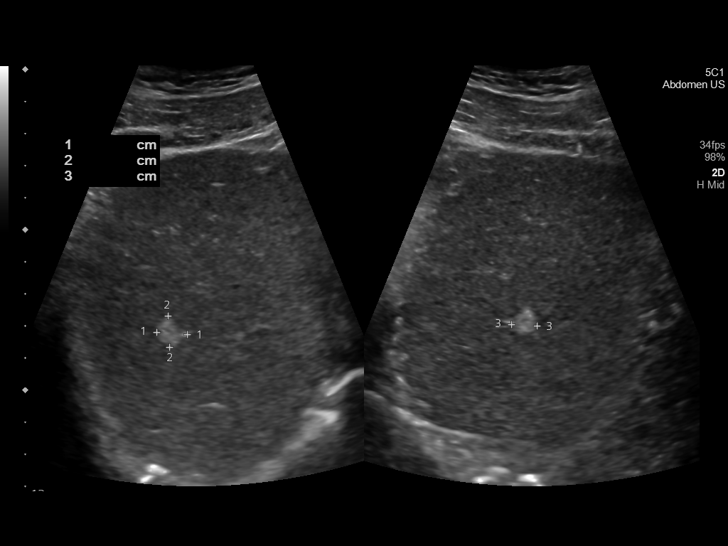

[13 of 25 positions shown; findings below may reference images not displayed]

FINDINGS: Gallbladder: There is a 2.7 mm polyp in the gallbladder. No sludge,
pericholecystic fluid, stones, wall thickening, or Murphy's sign.

Common bile duct: Diameter: 3.8 mm

Liver: There is a 1 cm hyperechoic mass without posterior shadowing
in the right hepatic lobe. The liver is otherwise normal in
appearance. Portal vein is patent on color Doppler imaging with
normal direction of blood flow towards the liver.

IVC: No abnormality visualized.

Pancreas: Partially obscured by shadowing bowel gas. No gross
abnormalities.

Spleen: Size and appearance within normal limits.

Right Kidney: Length: 10.1 cm. Contains a 9 mm simple cyst. No
follow-up imaging is recommended.

Left Kidney: Length: 9.3 cm. Echogenicity within normal limits. No
mass or hydronephrosis visualized.

Abdominal aorta: Scattered calcified atherosclerosis.  No aneurysm.

Other findings: None.
IMPRESSION: 1. There is a 2.7 mm polyp in the gallbladder. No follow-up imaging
is recommended for gallbladder polyps less than 6 mm.
2. 1 cm probable hemangioma in the right hepatic lobe.
3. Scattered calcified atherosclerotic change in the nonaneurysmal
aorta.

## 2023-01-23 NOTE — Telephone Encounter (Signed)
Requested Prescriptions  Pending Prescriptions Disp Refills   gabapentin (NEURONTIN) 600 MG tablet [Pharmacy Med Name: GABAPENTIN 600 MG TABLET] 90 tablet 1    Sig: TAKE 1 TABLET BY MOUTH 3 TIMES A DAY     Neurology: Anticonvulsants - gabapentin Passed - 01/21/2023  6:52 AM      Passed - Cr in normal range and within 360 days    Creatinine, Ser  Date Value Ref Range Status  08/29/2022 1.07 0.76 - 1.27 mg/dL Final         Passed - Completed PHQ-2 or PHQ-9 in the last 360 days      Passed - Valid encounter within last 12 months    Recent Outpatient Visits           1 month ago Primary hypertension   [place] [facility] Malva Limes, MD   5 months ago Primary hypertension   [place] [facility] Malva Limes, MD   8 months ago Rhinosinusitis   [place] [facility] [place] [place], PA-C   10 months ago Primary hypertension   [facility] Malva Limes, MD   12 months ago Primary hypertension   [place] [facility] [name], PA-C       Future Appointments             In 1 month Fisher, [name], MD [facility]

## 2023-03-06 ENCOUNTER — Ambulatory Visit: Payer: Commercial Managed Care - HMO | Admitting: Family Medicine

## 2023-03-06 VITALS — BP 147/95 | HR 69 | Temp 98.8°F | Ht 68.0 in | Wt 168.0 lb

## 2023-03-06 DIAGNOSIS — R42 Dizziness and giddiness: Secondary | ICD-10-CM

## 2023-03-06 DIAGNOSIS — E1065 Type 1 diabetes mellitus with hyperglycemia: Secondary | ICD-10-CM | POA: Insufficient documentation

## 2023-03-06 DIAGNOSIS — R7303 Prediabetes: Secondary | ICD-10-CM | POA: Insufficient documentation

## 2023-03-06 DIAGNOSIS — R739 Hyperglycemia, unspecified: Secondary | ICD-10-CM

## 2023-03-06 LAB — POCT GLYCOSYLATED HEMOGLOBIN (HGB A1C)
Est. average glucose Bld gHb Est-mCnc: 126
Hemoglobin A1C: 6 % — AB (ref 4.0–5.6)

## 2023-03-06 NOTE — Progress Notes (Signed)
Established patient visit   Patient: Justin Olsen   DOB: 04/17/1966   57 y.o. Male  MRN: [id number] Visit Date: 03/06/2023  Today's healthcare provider: [name], MD   Chief Complaint  Patient presents with   Hypertension   Hyperglycemia   Subjective    Discussed the use of AI scribe software for clinical note transcription with the patient, who gave verbal consent to proceed.  History of Present Illness   The patient, with a history of resistant hypertension, prediabetes, and restless leg syndrome, presents for a follow-up visit. His last recorded blood pressure was 149/103, and he was experiencing frequent headaches. To manage these symptoms, his gabapentin was increased to 600 mg three times a day, and clonazepam was increased to 1 mg at bedtime.  Recently, the patient reports a significant reduction in headache frequency, describing them as occurring 'once in a blue moon.' However, he has been experiencing episodes of lightheadedness, which are not daily but sporadic, occurring every one to two weeks. These episodes sometimes require the patient to sit down due to their intensity and can last for a few minutes. The patient has not been able to correlate these episodes with his blood pressure as he has not been able to check it during these episodes.  The patient also reports feeling excessively tired and sleepy recently, even without taking his medication. Despite these symptoms, his blood pressure has been relatively stable, and he has not reported any new chest pains or fluttering sensations.  The patient's medication compliance has been inconsistent due to his busy schedule, which includes helping his parents with appointments. Despite these challenges, the patient's blood pressure and restless leg syndrome appear to be well-controlled on his current medication regimen.       Medications: Outpatient Medications Prior to Visit  Medication Sig   albuterol (VENTOLIN  HFA) 108 (90 Base) MCG/ACT inhaler INHALE TWO PUFFS BY MOUTH EVERY 6 HOURS AS NEEDED FOR WHEEZING OR SHORTNESS OF BREATH   ASPIRIN 81 PO Take by mouth daily.   Aspirin-Salicylamide-Caffeine (BC HEADACHE POWDER PO) Take by mouth 2 (two) times daily as needed.   clonazePAM (KLONOPIN) 1 MG tablet Take 0.5-1 tablets (0.5-1 mg total) by mouth at bedtime.   diltiazem (CARDIZEM CD) 300 MG 24 hr capsule TAKE 1 CAPSULE BY MOUTH DAILY   Fluticasone-Umeclidin-Vilant (TRELEGY ELLIPTA) 100-62.5-25 MCG/ACT AEPB Inhale 1 puff into the lungs daily.   gabapentin (NEURONTIN) 600 MG tablet TAKE 1 TABLET BY MOUTH 3 TIMES A DAY   hydrochlorothiazide (HYDRODIURIL) 25 MG tablet TAKE 1 TABLET BY MOUTH DAILY   naproxen (NAPROSYN) 500 MG tablet TAKE 1 TABLET BY MOUTH TWICE A DAY AS NEEDED   pantoprazole (PROTONIX) 40 MG tablet TAKE 1 TABLET BY MOUTH DAILY   potassium chloride (KLOR-CON) 10 MEQ tablet Take 1 tablet (10 mEq total) by mouth daily.   rosuvastatin (CRESTOR) 20 MG tablet TAKE 1 TABLET BY MOUTH DAILY   [DISCONTINUED] oxyCODONE (ROXICODONE) 5 MG immediate release tablet Take 1-2 tablets (5-10 mg total) by mouth every 4 (four) hours as needed. (Patient not taking: Reported on 11/25/2022)   No facility-administered medications prior to visit.   Review of Systems     Objective    BP (!) 147/95 (BP Location: Left Arm, Patient Position: Sitting, Cuff Size: Normal)   Pulse 69   Temp 98.8 F (37.1 C) (Oral)   Ht 5\' 8"  (1.727 m)   Wt 168 lb (76.2 kg)   SpO2 100%  BMI 25.54 kg/m   Physical Exam  Physical Exam   CHEST: Lung sounds clear. CARDIOVASCULAR: Heart sounds normal. NEUROLOGICAL: Extraocular movements intact, pupillary response to light normal, no nystagmus, finger to nose test normal, Romberg negative.     Results for orders placed or performed in visit on 03/06/23  POCT glycosylated hemoglobin (Hb A1C)  Result Value Ref Range   Hemoglobin A1C 6.0 (A) 4.0 - 5.6 %   Est. average glucose Bld gHb  Est-mCnc 126     Assessment & Plan        Resistant Hypertension Blood pressure slightly elevated but improved from previous visit. Patient reports intermittent lightheadedness, possibly related to blood pressure fluctuations. No evidence of cardiac arrhythmia from previous monitoring. -Continue current antihypertensive regimen. -Consider blood pressure monitoring during lightheadedness episodes.  Headache Improvement in frequency and severity since last visit. Gabapentin dose increase to 600mg  three times a day appears effective. -Continue Gabapentin at current dose.  Restless Leg Syndrome Some improvement noted with increase in Clonazepam to 1mg  at bedtime. Patient reports inconsistent medication adherence. -Encourage consistent medication use. -Continue Clonazepam at current dose.  Prediabetes A1c stable today at 6.0% -Continue current lifestyle modifications. -Plan for A1c check at next physical.  General Health Maintenance / Followup Plans -Schedule physical in the spring, including EKG and routine blood work. -Refill current medications, send to [facility].    Return in about 6 months (around 09/03/2023) for Yearly Physical.      [name], MD  [facility] [phone] (phone) [phone] (fax)  [facility]

## 2023-03-06 NOTE — Patient Instructions (Signed)
.   Please review the attached list of medications and notify my office if there are any errors.   . Please bring all of your medications to every appointment so we can make sure that our medication list is the same as yours.   

## 2023-03-13 ENCOUNTER — Other Ambulatory Visit: Payer: Self-pay | Admitting: Family Medicine

## 2023-03-13 DIAGNOSIS — M19041 Primary osteoarthritis, right hand: Secondary | ICD-10-CM

## 2023-03-13 DIAGNOSIS — E7800 Pure hypercholesterolemia, unspecified: Secondary | ICD-10-CM

## 2023-03-13 DIAGNOSIS — G8929 Other chronic pain: Secondary | ICD-10-CM

## 2023-03-13 DIAGNOSIS — G2581 Restless legs syndrome: Secondary | ICD-10-CM

## 2023-03-13 DIAGNOSIS — R0600 Dyspnea, unspecified: Secondary | ICD-10-CM

## 2023-03-13 DIAGNOSIS — J432 Centrilobular emphysema: Secondary | ICD-10-CM

## 2023-03-13 DIAGNOSIS — I10 Essential (primary) hypertension: Secondary | ICD-10-CM

## 2023-03-13 NOTE — Telephone Encounter (Signed)
Medication Refill - Medication: naporoxen mg/gabapetin , mg/trellogy(doesn't have mg not on list) , albuterol ,/ diltiazem (CARDIZEM CD) 300 MG 24 hr capsule/pantoprazole (PROTONIX) 40 MG tablet    rosuvastatin (CRESTOR) 20 MG tablet / clonazePAM (KLONOPIN) 1 MG tablet   Has the patient contacted their pharmacy? Yes  (Agent: If no, request that the patient contact the pharmacy for the refill. If patient does not wish to contact the pharmacy document the reason why and proceed with request.) (Agent: If yes, when and what did the pharmacy advise?)  Preferred Pharmacy (with phone number or street name): [address] [place], [place] [address]  Has the patient been seen for an appointment in the last year OR does the patient have an upcoming appointment? yes  Agent: Please be advised that RX refills may take up to 3 business days. We ask that you follow-up with your pharmacy.

## 2023-03-14 ENCOUNTER — Telehealth: Payer: Self-pay

## 2023-03-14 MED ORDER — ALBUTEROL SULFATE HFA 108 (90 BASE) MCG/ACT IN AERS: 2.0000 | INHALATION_SPRAY | Freq: Four times a day (QID) | RESPIRATORY_TRACT | 3 refills | Status: DC | PRN

## 2023-03-14 MED ORDER — ROSUVASTATIN CALCIUM 20 MG PO TABS: 20.0000 mg | ORAL_TABLET | Freq: Every day | ORAL | 1 refills | Status: DC

## 2023-03-14 MED ORDER — NAPROXEN 500 MG PO TABS: 500.0000 mg | ORAL_TABLET | Freq: Two times a day (BID) | ORAL | 1 refills | Status: DC | PRN

## 2023-03-14 MED ORDER — CLONAZEPAM 1 MG PO TABS: 0.5000 mg | ORAL_TABLET | Freq: Every day | ORAL | 5 refills | Status: DC

## 2023-03-14 MED ORDER — DILTIAZEM HCL ER COATED BEADS 300 MG PO CP24: 300.0000 mg | ORAL_CAPSULE | Freq: Every day | ORAL | 0 refills | Status: DC

## 2023-03-14 MED ORDER — TRELEGY ELLIPTA 100-62.5-25 MCG/ACT IN AEPB: 1.0000 | INHALATION_SPRAY | Freq: Every day | RESPIRATORY_TRACT | 12 refills | Status: DC

## 2023-03-14 MED ORDER — GABAPENTIN 600 MG PO TABS: 600.0000 mg | ORAL_TABLET | Freq: Three times a day (TID) | ORAL | 1 refills | Status: DC

## 2023-03-14 NOTE — Telephone Encounter (Signed)
Requested Prescriptions  Pending Prescriptions Disp Refills   naproxen (NAPROSYN) 500 MG tablet 180 tablet 1    Sig: Take 1 tablet (500 mg total) by mouth 2 (two) times daily as needed.     Analgesics:  NSAIDS Failed - 03/14/2023 10:26 AM      Failed - Manual Review: Labs are only required if the patient has taken medication for more than 8 weeks.      Failed - HGB in normal range and within 360 days    Hemoglobin  Date Value Ref Range Status  03/07/2022 15.2 13.0 - 17.0 g/dL Final  16/03/9603 54.0 13.0 - 17.7 g/dL Final         Failed - PLT in normal range and within 360 days    Platelets  Date Value Ref Range Status  03/07/2022 222 150 - 400 K/uL Final  09/13/2021 210 150 - 450 x10E3/uL Final         Failed - HCT in normal range and within 360 days    HCT  Date Value Ref Range Status  03/07/2022 42.4 39.0 - 52.0 % Final   Hematocrit  Date Value Ref Range Status  09/13/2021 41.4 37.5 - 51.0 % Final         Passed - Cr in normal range and within 360 days    Creatinine, Ser  Date Value Ref Range Status  08/29/2022 1.07 0.76 - 1.27 mg/dL Final         Passed - eGFR is 30 or above and within 360 days    GFR, Estimated  Date Value Ref Range Status  07/28/2022 >60 >60 mL/min Final    Comment:    (NOTE) Calculated using the CKD-EPI Creatinine Equation (2021)    eGFR  Date Value Ref Range Status  08/29/2022 82 >59 mL/min/1.73 Final         Passed - Patient is not pregnant      Passed - Valid encounter within last 12 months    Recent Outpatient Visits           1 week ago Hyperglycemia   [facility], [facility]   3 months ago Primary hypertension   [place] [facility], MD   6 months ago Primary hypertension   [place] [facility], [facility]   10 months ago Rhinosinusitis   [place] [facility] [place], [place], PA-C   1 year ago Primary  hypertension   [place] [facility], [facility]       Future Appointments             In 5 months Fisher, [name], MD [facility]             gabapentin (NEURONTIN) 600 MG tablet 90 tablet 1    Sig: Take 1 tablet (600 mg total) by mouth 3 (three) times daily.     Neurology: Anticonvulsants - gabapentin Passed - 03/14/2023 10:26 AM      Passed - Cr in normal range and within 360 days    Creatinine, Ser  Date Value Ref Range Status  08/29/2022 1.07 0.76 - 1.27 mg/dL Final         Passed - Completed PHQ-2 or PHQ-9 in the last 360 days      Passed - Valid encounter within last 12 months    Recent Outpatient Visits  1 week ago Hyperglycemia   [facility]   3 months ago Primary hypertension   [place] [facility], MD   6 months ago Primary hypertension   [place] [facility], MD   10 months ago Rhinosinusitis   [place] [facility] [place], [place], [place]   1 year ago Primary hypertension   [place] [facility] Malva Limes, MD       Future Appointments             In 5 months Fisher, [name], MD [facility]             diltiazem (CARDIZEM CD) 300 MG 24 hr capsule 90 capsule 0    Sig: Take 1 capsule (300 mg total) by mouth daily.     Cardiovascular: Calcium Channel Blockers 3 Failed - 03/14/2023 10:26 AM      Failed - Last BP in normal range    BP Readings from Last 1 Encounters:  03/06/23 (!) 147/95         Passed - ALT in normal range and within 360 days    ALT  Date Value Ref Range Status  07/28/2022 20 0 - 44 U/L Final         Passed - AST in normal range and within 360 days    AST  Date Value Ref Range Status  07/28/2022 26 15 - 41 U/L Final         Passed - Cr in normal range and within 360  days    Creatinine, Ser  Date Value Ref Range Status  08/29/2022 1.07 0.76 - 1.27 mg/dL Final         Passed - Last Heart Rate in normal range    Pulse Readings from Last 1 Encounters:  03/06/23 69         Passed - Valid encounter within last 6 months    Recent Outpatient Visits           1 week ago Hyperglycemia   [place] [facility]   3 months ago Primary hypertension   [place] [facility], MD   6 months ago Primary hypertension   [place] [facility], MD   10 months ago Rhinosinusitis   [place] [facility] [place], [place], PA-C   1 year ago Primary hypertension   [place] [facility] Malva Limes, MD       Future Appointments             In 5 months Fisher, [name], MD [facility]             Fluticasone-Umeclidin-Vilant (TRELEGY ELLIPTA) 100-62.5-25 MCG/ACT AEPB 1 each 12    Sig: Inhale 1 puff into the lungs daily.     Off-Protocol Failed - 03/14/2023 10:26 AM      Failed - Medication not assigned to a protocol, review manually.      Passed - Valid encounter within last 12 months    Recent Outpatient Visits           1 week ago Hyperglycemia   [facility] Mal[facility], MD   3 months ago Primary hypertension   [facility]  6 months ago Primary hypertension   [place] [facility]   10 months ago Rhinosinusitis   [place] [facility] [place], [place], [place]   1 year ago Primary hypertension   [place] [facility] Malva Limes, MD       Future Appointments             In 5 months Fisher, [name], MD [facility]             albuterol (VENTOLIN HFA) 108 (90 Base) MCG/ACT inhaler 8.5  g 3    Sig: 2 puffs every 6 (six) hours as needed for wheezing or shortness of breath.     Pulmonology:  Beta Agonists 2 Failed - 03/14/2023 10:26 AM      Failed - Last BP in normal range    BP Readings from Last 1 Encounters:  03/06/23 (!) 147/95         Passed - Last Heart Rate in normal range    Pulse Readings from Last 1 Encounters:  03/06/23 69         Passed - Valid encounter within last 12 months    Recent Outpatient Visits           1 week ago Hyperglycemia   [place], [facility]   3 months ago Primary hypertension   [place] [facility] Mal[facility] L[facility], [facility]   6 months ago Primary hypertension   [place] [facility], MD   10 months ago Rhinosinusitis   [place] [facility] [place], [place], PA-C   1 year ago Primary hypertension   [place] [facility] Mal[facility] Limes, MD       Future Appointments             In 5 months Fisher, [name], MD [facility]             rosuvastatin (CRESTOR) 20 MG tablet 90 tablet 1    Sig: Take 1 tablet (20 mg total) by mouth daily.     Cardiovascular:  Antilipid - Statins 2 Failed - 03/14/2023 10:26 AM      Failed - Lipid Panel in normal range within the last 12 months    Cholesterol  Date Value Ref Range Status  07/28/2022 154 0 - 200 mg/dL Final   LDL Cholesterol  Date Value Ref Range Status  07/28/2022 71 0 - 99 mg/dL Final    Comment:           Total Cholesterol/HDL:CHD Risk Coronary Heart Disease Risk Table                     Men   Women  1/2 Average Risk   3.4   3.3  Average Risk       5.0   4.4  2 X Average Risk   9.6   7.1  3 X Average Risk  23.4   11.0        Use the calculated Patient Ratio above and the CHD Risk Table to determine the patient's CHD Risk.        ATP III CLASSIFICATION (LDL):  <100     mg/dL   Optimal  409-811  mg/dL   Near or  Above  Optimal  130-159  mg/dL   Borderline  782-956  mg/dL   High  >213     mg/dL   Very High Performed at [facility], [address] [place], [place] [address]    Direct LDL  Date Value Ref Range Status  07/28/2022 91 0 - 99 mg/dL Final    Comment:    Performed at [facility], [address], [place], [place] [address]   HDL  Date Value Ref Range Status  07/28/2022 49 >40 mg/dL Final   Triglycerides  Date Value Ref Range Status  07/28/2022 169 (H) <150 mg/dL Final         Passed - Cr in normal range and within 360 days    Creatinine, Ser  Date Value Ref Range Status  08/29/2022 1.07 0.76 - 1.27 mg/dL Final         Passed - Patient is not pregnant      Passed - Valid encounter within last 12 months    Recent Outpatient Visits           1 week ago Hyperglycemia   [place] [facility]   3 months ago Primary hypertension   [place] [facility], [facility]   6 months ago Primary hypertension   [place] [facility], [facility]   10 months ago Rhinosinusitis   [place] [facility] [place], [place], PA-C   1 year ago Primary hypertension   [place] [facility] Malva Limes, MD       Future Appointments             In 5 months Fisher, [name], MD [facility]             clonazePAM (KLONOPIN) 1 MG tablet 30 tablet 5    Sig: Take 0.5-1 tablets (0.5-1 mg total) by mouth at bedtime.     Not Delegated - Psychiatry: Anxiolytics/Hypnotics 2 Failed - 03/14/2023 10:26 AM      Failed - This refill cannot be delegated      Failed - Urine Drug Screen completed in last 360 days      Passed - Patient is not pregnant      Passed - Valid encounter within last 6 months    Recent Outpatient Visits           1 week ago Hyperglycemia   [place] [facility] Mal[facility] L[facility], MD   3 months ago Primary hypertension   [place] [facility]   6 months ago Primary hypertension   [place] [facility] Malva Limes, MD   10 months ago Rhinosinusitis   [place] [facility] [place], [place], PA-C   1 year ago Primary hypertension    [facility], MD       Future Appointments             In 5 months Fisher, [name], MD [facility]

## 2023-03-14 NOTE — Telephone Encounter (Signed)
I believed this request is a duplicate from yesterday refill request. That request was still in the nurse triage pool. Went ahead and routed to provider.

## 2023-03-14 NOTE — Telephone Encounter (Signed)
Copied from CRM [id number]. Topic: General - Other >> Mar 14, 2023  8:50 AM Franchot Heidelberg wrote: Reason for CRM: Pt called to report that he is almost out of his medications and says that his PCP told him last week that he would fill all of his medications. He is now almost out today. Advised that PCP has up to 3 days for refill requests, pt discussed this with him last week during his visit.

## 2023-03-14 NOTE — Telephone Encounter (Signed)
Requested medication (s) are due for refill today: Yes  Requested medication (s) are on the active medication list: Yes  Last refill:    Future visit scheduled: Yes  Notes to clinic:  Medications are manual review or not delegated.    Requested Prescriptions  Pending Prescriptions Disp Refills   naproxen (NAPROSYN) 500 MG tablet 180 tablet 1    Sig: Take 1 tablet (500 mg total) by mouth 2 (two) times daily as needed.     Analgesics:  NSAIDS Failed - 03/14/2023 10:26 AM      Failed - Manual Review: Labs are only required if the patient has taken medication for more than 8 weeks.      Failed - HGB in normal range and within 360 days    Hemoglobin  Date Value Ref Range Status  03/07/2022 15.2 13.0 - 17.0 g/dL Final  16/03/9603 54.0 13.0 - 17.7 g/dL Final         Failed - PLT in normal range and within 360 days    Platelets  Date Value Ref Range Status  03/07/2022 222 150 - 400 K/uL Final  09/13/2021 210 150 - 450 x10E3/uL Final         Failed - HCT in normal range and within 360 days    HCT  Date Value Ref Range Status  03/07/2022 42.4 39.0 - 52.0 % Final   Hematocrit  Date Value Ref Range Status  09/13/2021 41.4 37.5 - 51.0 % Final         Passed - Cr in normal range and within 360 days    Creatinine, Ser  Date Value Ref Range Status  08/29/2022 1.07 0.76 - 1.27 mg/dL Final         Passed - eGFR is 30 or above and within 360 days    GFR, Estimated  Date Value Ref Range Status  07/28/2022 >60 >60 mL/min Final    Comment:    (NOTE) Calculated using the CKD-EPI Creatinine Equation (2021)    eGFR  Date Value Ref Range Status  08/29/2022 82 >59 mL/min/1.73 Final         Passed - Patient is not pregnant      Passed - Valid encounter within last 12 months    Recent Outpatient Visits           1 week ago Hyperglycemia   [place] [facility]   3 months ago Primary hypertension   [place] [facility]  Malva Limes, MD   6 months ago Primary hypertension   [place], MD   10 months ago Rhinosinusitis   [place] [facility] [place], [place], PA-C   1 year ago Primary hypertension   [place] [facility] Malva L[facility], MD       Future Appointments             In 5 months Fisher, [name], MD [facility]             Fluticasone-Umeclidin-Vilant (TRELEGY ELLIPTA) 100-62.5-25 MCG/ACT AEPB 1 each 12    Sig: Inhale 1 puff into the lungs daily.     Off-Protocol Failed - 03/14/2023 10:26 AM      Failed - Medication not assigned to a protocol, review manually.      Passed - Valid encounter within last 12 months    Recent Outpatient Visits  1 week ago Hyperglycemia   [place] [facility], [facility]   3 months ago Primary hypertension   [place] [facility], [facility]   6 months ago Primary hypertension   [place] [facility] Mal[facility] L[facility], MD   10 months ago Rhinosinusitis   [place] [facility] [place], [place], [place]   1 year ago Primary hypertension   [place] [facility] Malva Limes, MD       Future Appointments             In 5 months Fisher, [name], MD [facility]             clonazePAM (KLONOPIN) 1 MG tablet 30 tablet 5    Sig: Take 0.5-1 tablets (0.5-1 mg total) by mouth at bedtime.     Not Delegated - Psychiatry: Anxiolytics/Hypnotics 2 Failed - 03/14/2023 10:26 AM      Failed - This refill cannot be delegated      Failed - Urine Drug Screen completed in last 360 days      Passed - Patient is not pregnant      Passed - Valid encounter within last 6 months    Recent Outpatient Visits           1 week ago Hyperglycemia   [place] [facility]    3 months ago Primary hypertension   [place] [facility] Mal[facility], [facility]   6 months ago Primary hypertension   [place] [facility], MD   10 months ago Rhinosinusitis   [place] [facility] [place], [place], PA-C   1 year ago Primary hypertension   [place] [facility] Malva Limes, MD       Future Appointments             In 5 months Fisher, [name], MD [facility]            Signed Prescriptions Disp Refills   gabapentin (NEURONTIN) 600 MG tablet 90 tablet 1    Sig: Take 1 tablet (600 mg total) by mouth 3 (three) times daily.     Neurology: Anticonvulsants - gabapentin Passed - 03/14/2023 10:26 AM      Passed - Cr in normal range and within 360 days    Creatinine, Ser  Date Value Ref Range Status  08/29/2022 1.07 0.76 - 1.27 mg/dL Final         Passed - Completed PHQ-2 or PHQ-9 in the last 360 days      Passed - Valid encounter within last 12 months    Recent Outpatient Visits           1 week ago Hyperglycemia   [facility]   3 months ago Primary hypertension   [place] [facility] Mal[facility], MD   6 months ago Primary hypertension   [place] [facility], MD   10 months ago Rhinosinusitis   [place] [facility] [place], [place], PA-C   1 year ago Primary hypertension    [facility] Mal[facility], MD       Future Appointments             In 5 months Sherrie Mustache, [name],  [facility]             diltiazem (CARDIZEM CD) 300 MG 24 hr capsule 90 capsule 0    Sig: Take 1 capsule (300 mg total) by mouth daily.     Cardiovascular: Calcium Channel Blockers 3 Failed - 03/14/2023 10:26 AM      Failed - Last BP in normal range    BP Readings from Last  1 Encounters:  03/06/23 (!) 147/95         Passed - ALT in normal range and within 360 days    ALT  Date Value Ref Range Status  07/28/2022 20 0 - 44 U/L Final         Passed - AST in normal range and within 360 days    AST  Date Value Ref Range Status  07/28/2022 26 15 - 41 U/L Final         Passed - Cr in normal range and within 360 days    Creatinine, Ser  Date Value Ref Range Status  08/29/2022 1.07 0.76 - 1.27 mg/dL Final         Passed - Last Heart Rate in normal range    Pulse Readings from Last 1 Encounters:  03/06/23 69         Passed - Valid encounter within last 6 months    Recent Outpatient Visits           1 week ago Hyperglycemia   [facility]   3 months ago Primary hypertension   [place] [facility], MD   6 months ago Primary hypertension   [place] [facility], [facility]   10 months ago Rhinosinusitis   [place] [facility] [place], [place], PA-C   1 year ago Primary hypertension   [place] [facility] [address] [facility] Malva Limes, MD       Future Appointments             In 5 months Fisher, [name], MD [facility]             albuterol (VENTOLIN HFA) 108 (90 Base) MCG/ACT inhaler 8.5 g 3    Sig: Inhale 2 puffs into the lungs every 6 (six) hours as needed for wheezing or shortness of breath.     Pulmonology:  Beta Agonists 2 Failed - 03/14/2023 10:26 AM      Failed - Last BP in normal range    BP Readings from Last 1 Encounters:  03/06/23 (!) 147/95         Passed - Last Heart Rate in normal range    Pulse Readings from Last 1 Encounters:  03/06/23 69         Passed - Valid encounter within last 12 months    Recent Outpatient Visits           1 week ago Hyperglycemia   [facility]   3 months ago Primary hypertension    [place] [facility] Malva L[facility], MD   6 months ago Primary hypertension   [place] [facility] Malva Limes, MD   10 months ago Rhinosinusitis   [place] [facility] [place], [place], PA-C   1 year ago Primary hypertension   [facility] Malva Limes, MD  Future Appointments             In 5 months Fisher, [name], MD [facility]             rosuvastatin (CRESTOR) 20 MG tablet 90 tablet 1    Sig: Take 1 tablet (20 mg total) by mouth daily.     Cardiovascular:  Antilipid - Statins 2 Failed - 03/14/2023 10:26 AM      Failed - Lipid Panel in normal range within the last 12 months    Cholesterol  Date Value Ref Range Status  07/28/2022 154 0 - 200 mg/dL Final   LDL Cholesterol  Date Value Ref Range Status  07/28/2022 71 0 - 99 mg/dL Final    Comment:           Total Cholesterol/HDL:CHD Risk Coronary Heart Disease Risk Table                     Men   Women  1/2 Average Risk   3.4   3.3  Average Risk       5.0   4.4  2 X Average Risk   9.6   7.1  3 X Average Risk  23.4   11.0        Use the calculated Patient Ratio above and the CHD Risk Table to determine the patient's CHD Risk.        ATP III CLASSIFICATION (LDL):  <100     mg/dL   Optimal  098-119  mg/dL   Near or Above                    Optimal  130-159  mg/dL   Borderline  147-829  mg/dL   High  >562     mg/dL   Very High Performed at [facility], [address] [place], [place] [address]    Direct LDL  Date Value Ref Range Status  07/28/2022 91 0 - 99 mg/dL Final    Comment:    Performed at [facility], [address], [place], [place] [address]   HDL  Date Value Ref Range Status  07/28/2022 49 >40 mg/dL Final   Triglycerides  Date Value Ref Range Status  07/28/2022 169 (H) <150 mg/dL Final         Passed - Cr in normal range and within 360 days     Creatinine, Ser  Date Value Ref Range Status  08/29/2022 1.07 0.76 - 1.27 mg/dL Final         Passed - Patient is not pregnant      Passed - Valid encounter within last 12 months    Recent Outpatient Visits           1 week ago Hyperglycemia   [facility] MD   3 months ago Primary hypertension   [facility], MD   6 months ago Primary hypertension   [place] [facility], MD   10 months ago Rhinosinusitis   [place] [facility] [place], [place], PA-C   1 year ago Primary hypertension   [place] [facility] Malva Limes, MD       Future Appointments             In 5 months Fisher, [name], MD [facility]

## 2023-03-19 ENCOUNTER — Other Ambulatory Visit: Payer: Self-pay | Admitting: Family Medicine

## 2023-03-19 DIAGNOSIS — I10 Essential (primary) hypertension: Secondary | ICD-10-CM

## 2023-03-19 DIAGNOSIS — R519 Headache, unspecified: Secondary | ICD-10-CM

## 2023-06-30 ENCOUNTER — Other Ambulatory Visit: Payer: Self-pay | Admitting: Family Medicine

## 2023-07-06 ENCOUNTER — Other Ambulatory Visit: Payer: Self-pay | Admitting: Family Medicine

## 2023-07-06 DIAGNOSIS — G2581 Restless legs syndrome: Secondary | ICD-10-CM

## 2023-07-06 NOTE — Telephone Encounter (Signed)
Requested medication (s) are due for refill today: no  Requested medication (s) are on the active medication list: yes  Last refill:  03/14/23 #30/5  Future visit scheduled: yes  Notes to clinic:  Unable to refill/refuse per protocol, cannot delegate.      Requested Prescriptions  Pending Prescriptions Disp Refills   clonazePAM (KLONOPIN) 1 MG tablet 30 tablet 5    Sig: Take 0.5-1 tablets (0.5-1 mg total) by mouth at bedtime.     Not Delegated - Psychiatry: Anxiolytics/Hypnotics 2 Failed - 07/06/2023  5:32 PM      Failed - This refill cannot be delegated      Failed - Urine Drug Screen completed in last 360 days      Passed - Patient is not pregnant      Passed - Valid encounter within last 6 months    Recent Outpatient Visits           4 months ago Hyperglycemia   [facility], MD   7 months ago Primary hypertension   [place] [facility] Mal[facility] L[facility], MD   10 months ago Primary hypertension   [place] [facility], MD   1 year ago Rhinosinusitis   [place] [facility] [place], [place], PA-C   1 year ago Primary hypertension   [place] [facility] Malva Limes, MD       Future Appointments             In 2 months Fisher, [name], MD [facility]

## 2023-07-06 NOTE — Telephone Encounter (Signed)
Medication Refill -  Most Recent Primary Care Visit:  Provider: Malva Limes  Department: [facility]  Visit Type: OFFICE VISIT  Date: 03/06/2023  Medication: clonazePAM (KLONOPIN) 1 MG tablet   Has the patient contacted their pharmacy? No (Agent: If no, request that the patient contact the pharmacy for the refill. If patient does not wish to contact the pharmacy document the reason why and proceed with request.) (Agent: If yes, when and what did the pharmacy advise?)  Is this the correct pharmacy for this prescription? Yes If no, delete pharmacy and type the correct one.  This is the patient's preferred pharmacy:  [facility] [id number] [place] - [address] [place] [address] Phone: [phone] Fax: [phone]   Has the prescription been filled recently? Yes  Is the patient out of the medication? Yes  Has the patient been seen for an appointment in the last year OR does the patient have an upcoming appointment? Yes  Can we respond through MyChart? Yes  Agent: Please be advised that Rx refills may take up to 3 business days. We ask that you follow-up with your pharmacy.

## 2023-07-09 MED ORDER — CLONAZEPAM 1 MG PO TABS: 0.5000 mg | ORAL_TABLET | Freq: Every day | ORAL | 5 refills | Status: DC

## 2023-07-31 DIAGNOSIS — M1812 Unilateral primary osteoarthritis of first carpometacarpal joint, left hand: Secondary | ICD-10-CM | POA: Diagnosis not present

## 2023-07-31 DIAGNOSIS — M25532 Pain in left wrist: Secondary | ICD-10-CM | POA: Diagnosis not present

## 2023-07-31 DIAGNOSIS — M19031 Primary osteoarthritis, right wrist: Secondary | ICD-10-CM | POA: Diagnosis not present

## 2023-09-04 ENCOUNTER — Ambulatory Visit (INDEPENDENT_AMBULATORY_CARE_PROVIDER_SITE_OTHER): Payer: Commercial Managed Care - HMO | Admitting: Family Medicine

## 2023-09-04 ENCOUNTER — Encounter: Payer: Self-pay | Admitting: Family Medicine

## 2023-09-04 VITALS — BP 141/94 | HR 82 | Resp 16 | Ht 68.0 in | Wt 165.0 lb

## 2023-09-04 DIAGNOSIS — Z0000 Encounter for general adult medical examination without abnormal findings: Secondary | ICD-10-CM

## 2023-09-04 DIAGNOSIS — Z0001 Encounter for general adult medical examination with abnormal findings: Secondary | ICD-10-CM

## 2023-09-04 DIAGNOSIS — H9312 Tinnitus, left ear: Secondary | ICD-10-CM

## 2023-09-04 DIAGNOSIS — M19042 Primary osteoarthritis, left hand: Secondary | ICD-10-CM

## 2023-09-04 DIAGNOSIS — Z1211 Encounter for screening for malignant neoplasm of colon: Secondary | ICD-10-CM

## 2023-09-04 DIAGNOSIS — Z8673 Personal history of transient ischemic attack (TIA), and cerebral infarction without residual deficits: Secondary | ICD-10-CM

## 2023-09-04 DIAGNOSIS — R5383 Other fatigue: Secondary | ICD-10-CM

## 2023-09-04 DIAGNOSIS — K219 Gastro-esophageal reflux disease without esophagitis: Secondary | ICD-10-CM | POA: Diagnosis not present

## 2023-09-04 DIAGNOSIS — I10 Essential (primary) hypertension: Secondary | ICD-10-CM

## 2023-09-04 DIAGNOSIS — G2581 Restless legs syndrome: Secondary | ICD-10-CM | POA: Diagnosis not present

## 2023-09-04 DIAGNOSIS — J432 Centrilobular emphysema: Secondary | ICD-10-CM

## 2023-09-04 DIAGNOSIS — R7303 Prediabetes: Secondary | ICD-10-CM

## 2023-09-04 DIAGNOSIS — F17210 Nicotine dependence, cigarettes, uncomplicated: Secondary | ICD-10-CM

## 2023-09-04 DIAGNOSIS — M19041 Primary osteoarthritis, right hand: Secondary | ICD-10-CM

## 2023-09-04 DIAGNOSIS — Z125 Encounter for screening for malignant neoplasm of prostate: Secondary | ICD-10-CM

## 2023-09-04 MED ORDER — NAPROXEN 500 MG PO TABS: 500.0000 mg | ORAL_TABLET | Freq: Two times a day (BID) | ORAL | 2 refills | Status: AC | PRN

## 2023-09-04 NOTE — Progress Notes (Signed)
 Complete physical exam   Patient: Justin Olsen   DOB: 1965/10/22   58 y.o. Male  MRN: [id number] Visit Date: 09/04/2023  Today's healthcare provider: [name], MD   Chief Complaint  Patient presents with   Annual Exam    Eye Exam: not up to date Patient reports stop gabapentin a week ago,side effects.   Tinnitus   Subjective    Discussed the use of AI scribe software for clinical note transcription with the patient, who gave verbal consent to proceed.  History of Present Illness   Justin Olsen is a 58 year old male who presents for an annual physical exam.  He experiences sensations in his left ear as if something is crawling in it or his hair is tickling it, along with occasional high-pitched ringing. These symptoms have persisted for several months. The discomfort has decreased since discontinuing gabapentin last week, although the ear remains sore from frequent touching.  He describes episodes of feeling 'funny' while driving, characterized by a fuzzy sensation in his head, which he finds frightening. He attributes these symptoms to gabapentin, which he stopped taking due to concerns about its effects on his kidneys, given his family history of kidney issues.  He has a history of arthritis and was taking gabapentin for pain management but has discontinued it. He continues to take naproxen for arthritis pain and reports that his hands are not in the greatest shape. He also takes medication for cholesterol and blood pressure management, including diltiazem.  He experiences occasional headaches, which he associates with changes in weather affecting his sinuses. The frequency of headaches has decreased to about once every one to two weeks.  He smokes a pack and a half of cigarettes per day and has not attempted to quit. He is considered disabled and is awaiting government assistance.  He does not eat breakfast due to a long-standing habit and experiences indigestion  if he eats in the morning. He reports feeling fatigued despite sleeping, especially since he is no longer working and has fewer activities to engage in.     He continues on diltiazem for hypertension and chronic headaches, which he is doing well with.    He continues on rosuvastatin for lipids and had calcium score of 31.2 last year.   He continues on Trelegy for COPD and reports it has been working well. Now smoking 1 1/2 ppd and not ready to try to quit.   Past Medical History:  Diagnosis Date   Aortic atherosclerosis (HCC)    CAD (coronary artery disease)    Emphysema lung (HCC)    Essential hypertension    GERD (gastroesophageal reflux disease)    Headache    Wears dentures    full upper and lower   Past Surgical History:  Procedure Laterality Date   CARPOMETACARPAL [facility] [place]) FUSION OF THUMB Right 09/06/2022   Procedure: Right thumb carpometacarpal arthroplasty;  Surgeon: Kennedy Bucker, MD;  Location: [facility];  Service: Orthopedics;  Laterality: Right;   Social History   Socioeconomic History   Marital status: Married    Spouse name: Not on file   Number of children: Not on file   Years of education: Not on file   Highest education level: Not on file  Occupational History   Not on file  Tobacco Use   Smoking status: Every Day    Current packs/day: 1.00    Average packs/day: 1 pack/day for 40.0 years (40.0 ttl pk-yrs)  Types: Cigarettes   Smokeless tobacco: Never   Tobacco comments:    Started smoking age 89  Vaping Use   Vaping status: Never Used  Substance and Sexual Activity   Alcohol use: Yes    Alcohol/week: 12.0 standard drinks of alcohol    Types: 10 Cans of beer, 2 Shots of liquor per week    Comment: 1-2 drinks (beer or liquor) daily, 5th every 3 days   Drug use: Not Currently   Sexual activity: Not on file  Other Topics Concern   Not on file  Social History Narrative   Not on file   Social Drivers of Health   Financial Resource Strain:  Not on file  Food Insecurity: Not on file  Transportation Needs: Not on file  Physical Activity: Not on file  Stress: Not on file  Social Connections: Not on file  Intimate Partner Violence: Not on file   Family Status  Relation Name Status   Mother  Alive   Father  Alive   MGF  Deceased   PGF  Deceased  No partnership data on file   Family History  Problem Relation Age of Onset   Stroke Mother    Diabetes Mother    Diabetes Father    Heart disease Father    Cancer Maternal Grandfather    Cancer Paternal Grandfather    Allergies  Allergen Reactions   Lisinopril     Orthostasis    Patient Care Team: Malva Limes, MD as PCP - General (Family Medicine) Antonieta Iba, MD as PCP - Cardiology (Cardiology)   Medications: Outpatient Medications Prior to Visit  Medication Sig   albuterol (VENTOLIN HFA) 108 (90 Base) MCG/ACT inhaler Inhale 2 puffs into the lungs every 6 (six) hours as needed for wheezing or shortness of breath.   ASPIRIN 81 PO Take by mouth daily.   Aspirin-Salicylamide-Caffeine (BC HEADACHE POWDER PO) Take by mouth 2 (two) times daily as needed.   clonazePAM (KLONOPIN) 1 MG tablet Take 0.5-1 tablets (0.5-1 mg total) by mouth at bedtime.   diltiazem (CARDIZEM CD) 300 MG 24 hr capsule TAKE 1 CAPSULE(300 MG) BY MOUTH DAILY   Fluticasone-Umeclidin-Vilant (TRELEGY ELLIPTA) 100-62.5-25 MCG/ACT AEPB Inhale 1 puff into the lungs daily.   hydrochlorothiazide (HYDRODIURIL) 25 MG tablet TAKE 1 TABLET BY MOUTH DAILY   naproxen (NAPROSYN) 500 MG tablet Take 1 tablet (500 mg total) by mouth 2 (two) times daily as needed.   pantoprazole (PROTONIX) 40 MG tablet TAKE 1 TABLET BY MOUTH DAILY   rosuvastatin (CRESTOR) 20 MG tablet Take 1 tablet (20 mg total) by mouth daily.   gabapentin (NEURONTIN) 600 MG tablet TAKE 1 TABLET(600 MG) BY MOUTH THREE TIMES DAILY   potassium chloride (KLOR-CON) 10 MEQ tablet Take 1 tablet (10 mEq total) by mouth daily.   No  facility-administered medications prior to visit.    Review of Systems    Objective    BP (!) 141/94 (BP Location: Left Arm, Patient Position: Sitting)   Pulse 82   Resp 16   Ht 5\' 8"  (1.727 m)   Wt 165 lb (74.8 kg)   SpO2 96%   BMI 25.09 kg/m    Physical Exam   General Appearance:    Well developed, well nourished male. Alert, cooperative, in no acute distress, appears stated age  Head:    Normocephalic, without obvious abnormality, atraumatic  Eyes:    PERRL, conjunctiva/corneas clear, EOM's intact, fundi    benign, both eyes  Ears:    Normal TM's and external ear canals, both ears  Nose:   Nares normal, septum midline, mucosa normal, no drainage   or sinus tenderness  Throat:   Lips, mucosa, and tongue normal; teeth and gums normal  Neck:   Supple, symmetrical, trachea midline, no adenopathy;       thyroid:  No enlargement/tenderness/nodules; no carotid   bruit or JVD  Back:     Symmetric, no curvature, ROM normal, no CVA tenderness  Lungs:     Clear to auscultation bilaterally, respirations unlabored  Chest wall:    No tenderness or deformity  Heart:    Normal heart rate. Normal rhythm. No murmurs, rubs, or gallops.  S1 and S2 normal  Abdomen:     Soft, non-tender, bowel sounds active all four quadrants,    no masses, no organomegaly  Genitalia:    deferred  Rectal:    deferred  Extremities:   All extremities are intact. No cyanosis or edema  Pulses:   2+ and symmetric all extremities  Skin:   Skin color, texture, turgor normal, no rashes or lesions  Lymph nodes:   Cervical, supraclavicular, and axillary nodes normal  Neurologic:   CNII-XII intact. Normal strength, sensation and reflexes      throughout     Last depression screening scores    09/04/2023    9:44 AM 03/06/2023    2:47 PM 11/25/2022    3:06 PM  PHQ 2/9 Scores  PHQ - 2 Score 1 0 0  PHQ- 9 Score 4 2 6    Last fall risk screening    09/04/2023    9:44 AM  Fall Risk   Falls in the past year?  1  Number falls in past yr: 0  Injury with Fall? 0  Risk for fall due to : No Fall Risks   Last Audit-C alcohol use screening    08/19/2022    2:45 PM  Alcohol Use Disorder Test (AUDIT)  1. How often do you have a drink containing alcohol? 3  2. How many drinks containing alcohol do you have on a typical day when you are drinking? 1  3. How often do you have six or more drinks on one occasion? 2  AUDIT-C Score 6   A score of 3 or more in women, and 4 or more in men indicates increased risk for alcohol abuse, EXCEPT if all of the points are from question 1   No results found for any visits on 09/04/23.  Assessment & Plan    Routine Health Maintenance and Physical Exam  Exercise Activities and Dietary recommendations  Goals   None     Immunization History  Administered Date(s) Administered   Influenza,inj,Quad PF,6+ Mos 02/19/2021, 02/25/2022   Influenza-Unspecified 02/27/2023   PNEUMOCOCCAL CONJUGATE-20 02/27/2023   Tdap 11/25/2022   Zoster Recombinant(Shingrix) 02/24/2022, 06/24/2022    Health Maintenance  Topic Date Due   HIV Screening  Never done   Colonoscopy  Never done   COVID-19 Vaccine (1 - 2024-25 season) Never done   Lung Cancer Screening  08/19/2023   DTaP/Tdap/Td (2 - Td or Tdap) 11/24/2032   Pneumococcal Vaccine 28-47 Years old  Completed   INFLUENZA VACCINE  Completed   Hepatitis C Screening  Completed   Zoster Vaccines- Shingrix  Completed   HPV VACCINES  Aged Out    Discussed health benefits of physical activity, and encouraged him to engage in regular exercise appropriate for his age and condition.  2.  Primary hypertension Reasonable well controlled on current medications. On CCB due to history of chronic incapacitating headaches which have greatly improved.  - CBC - Comprehensive metabolic panel - Lipid panel  3. Gastroesophageal reflux disease without esophagitis Well controlled on pantoprazole.   4. Restless leg syndrome Stopped  gabapentin due to suspect side effects.   5. History of lacunar cerebrovascular accident (CVA) Asymptomatic. Compliant with medication.  Continue aggressive risk factor modification.    6. Centrilobular emphysema (HCC) Still smoking, encourage cessation. Trelegy has been effective for symptom relief.   7. Prediabetes  - Hemoglobin A1c  8. Prostate cancer screening  - PSA Total (Reflex To Free)  9. Smoking greater than 20 pack years  - Ambulatory Referral for Lung Cancer Scre  10. Colon cancer screening  - Cologuard  11. Other fatigue  - TSH  12. Primary osteoarthritis of both hands  - naproxen (NAPROSYN) 500 MG tablet; Take 1 tablet (500 mg total) by mouth 2 (two) times daily as needed.  Dispense: 180 tablet; Refill: 2  13. Tinnitus of left ear Improved since he stopped gabapentin. Offered ENT referral if continues or worsens.         [name], MD  [facility] [phone] (phone) [phone] (fax)  [facility]

## 2023-09-04 NOTE — Patient Instructions (Signed)
Please review the attached list of medications and notify my office if there are any errors.    Please go to the lab draw station in [address] on the second floor of [facility] . Normal hours are 8:00am to 11:30am and 1:00pm to 4:00pm Monday through Friday

## 2023-09-06 DIAGNOSIS — Z125 Encounter for screening for malignant neoplasm of prostate: Secondary | ICD-10-CM | POA: Diagnosis not present

## 2023-09-06 DIAGNOSIS — R7303 Prediabetes: Secondary | ICD-10-CM | POA: Diagnosis not present

## 2023-09-06 DIAGNOSIS — I10 Essential (primary) hypertension: Secondary | ICD-10-CM | POA: Diagnosis not present

## 2023-09-06 DIAGNOSIS — R5383 Other fatigue: Secondary | ICD-10-CM | POA: Diagnosis not present

## 2023-09-07 LAB — COMPREHENSIVE METABOLIC PANEL
ALT: 19 IU/L (ref 0–44)
AST: 24 IU/L (ref 0–40)
Albumin: 4.8 g/dL (ref 3.8–4.9)
Alkaline Phosphatase: 82 IU/L (ref 44–121)
BUN/Creatinine Ratio: 19 (ref 9–20)
BUN: 20 mg/dL (ref 6–24)
Bilirubin Total: 0.3 mg/dL (ref 0.0–1.2)
CO2: 21 mmol/L (ref 20–29)
Calcium: 10.4 mg/dL — ABNORMAL HIGH (ref 8.7–10.2)
Chloride: 101 mmol/L (ref 96–106)
Creatinine, Ser: 1.06 mg/dL (ref 0.76–1.27)
Globulin, Total: 2.6 g/dL (ref 1.5–4.5)
Glucose: 88 mg/dL (ref 70–99)
Potassium: 4.3 mmol/L (ref 3.5–5.2)
Sodium: 145 mmol/L — ABNORMAL HIGH (ref 134–144)
Total Protein: 7.4 g/dL (ref 6.0–8.5)
eGFR: 83 mL/min/{1.73_m2} (ref >59)

## 2023-09-07 LAB — LIPID PANEL
Chol/HDL Ratio: 4.5 ratio (ref 0.0–5.0)
Cholesterol, Total: 165 mg/dL (ref 100–199)
HDL: 37 mg/dL — ABNORMAL LOW (ref >39)
LDL Chol Calc (NIH): 59 mg/dL (ref 0–99)
Triglycerides: 452 mg/dL — ABNORMAL HIGH (ref 0–149)
VLDL Cholesterol Cal: 69 mg/dL — ABNORMAL HIGH (ref 5–40)

## 2023-09-07 LAB — CBC
Hematocrit: 46.2 % (ref 37.5–51.0)
Hemoglobin: 16.3 g/dL (ref 13.0–17.7)
MCH: 33.5 pg — ABNORMAL HIGH (ref 26.6–33.0)
MCHC: 35.3 g/dL (ref 31.5–35.7)
MCV: 95 fL (ref 79–97)
Platelets: 244 10*3/uL (ref 150–450)
RBC: 4.86 x10E6/uL (ref 4.14–5.80)
RDW: 13.8 % (ref 11.6–15.4)
WBC: 10.9 10*3/uL — ABNORMAL HIGH (ref 3.4–10.8)

## 2023-09-07 LAB — HEMOGLOBIN A1C
Est. average glucose Bld gHb Est-mCnc: 126 mg/dL
Hgb A1c MFr Bld: 6 % — ABNORMAL HIGH (ref 4.8–5.6)

## 2023-09-07 LAB — PSA TOTAL (REFLEX TO FREE): Prostate Specific Ag, Serum: 0.9 ng/mL (ref 0.0–4.0)

## 2023-09-07 LAB — TSH: TSH: 2.31 u[IU]/mL (ref 0.450–4.500)

## 2023-09-08 ENCOUNTER — Encounter: Payer: Self-pay | Admitting: Family Medicine

## 2023-09-08 ENCOUNTER — Other Ambulatory Visit: Payer: Self-pay | Admitting: Family Medicine

## 2023-09-08 DIAGNOSIS — E7800 Pure hypercholesterolemia, unspecified: Secondary | ICD-10-CM

## 2023-09-08 MED ORDER — ROSUVASTATIN CALCIUM 20 MG PO TABS: 20.0000 mg | ORAL_TABLET | Freq: Every day | ORAL | 2 refills | Status: DC

## 2023-09-15 ENCOUNTER — Other Ambulatory Visit: Payer: Self-pay | Admitting: Family Medicine

## 2023-09-15 DIAGNOSIS — K219 Gastro-esophageal reflux disease without esophagitis: Secondary | ICD-10-CM

## 2023-10-05 ENCOUNTER — Encounter: Payer: Self-pay | Admitting: Family Medicine

## 2023-12-14 ENCOUNTER — Other Ambulatory Visit: Payer: Self-pay | Admitting: Family Medicine

## 2023-12-14 DIAGNOSIS — I10 Essential (primary) hypertension: Secondary | ICD-10-CM

## 2024-01-10 ENCOUNTER — Other Ambulatory Visit: Payer: Self-pay | Admitting: Family Medicine

## 2024-01-10 DIAGNOSIS — I10 Essential (primary) hypertension: Secondary | ICD-10-CM

## 2024-01-12 ENCOUNTER — Other Ambulatory Visit: Payer: Self-pay | Admitting: Family Medicine

## 2024-01-12 DIAGNOSIS — G2581 Restless legs syndrome: Secondary | ICD-10-CM

## 2024-01-24 ENCOUNTER — Encounter: Payer: Self-pay | Admitting: Pharmacy Technician

## 2024-01-24 ENCOUNTER — Other Ambulatory Visit (HOSPITAL_COMMUNITY): Payer: Self-pay

## 2024-01-24 NOTE — Telephone Encounter (Signed)
 ERROR

## 2024-01-30 ENCOUNTER — Other Ambulatory Visit (HOSPITAL_COMMUNITY): Payer: Self-pay

## 2024-02-08 ENCOUNTER — Other Ambulatory Visit: Payer: Self-pay | Admitting: Family Medicine

## 2024-02-08 DIAGNOSIS — I10 Essential (primary) hypertension: Secondary | ICD-10-CM

## 2024-02-12 ENCOUNTER — Telehealth: Payer: Self-pay

## 2024-02-12 ENCOUNTER — Other Ambulatory Visit (HOSPITAL_COMMUNITY): Payer: Self-pay

## 2024-02-12 NOTE — Telephone Encounter (Signed)
 Pharmacy Patient Advocate Encounter   Received notification from Onbase that prior authorization for Trelegy Ellipta  100-62.5-25MCG/ACT aerosol powder  is required/requested.   Insurance verification completed.   The patient is insured through [facility] [place] .   Per test claim: PA required; PA submitted to above mentioned insurance via Latent Key/confirmation #/EOC [id number] Status is pending

## 2024-02-12 NOTE — Telephone Encounter (Signed)
 Pharmacy Patient Advocate Encounter  Received notification from [facility] that Prior Authorization for Trelegy Ellipta  100-62.5-25MCG/ACT aerosol powder  has been DENIED.  See denial reason below. No denial letter attached in CMM. Will attach denial letter to Media tab once received.   PA #/Case ID/Reference #: [id number]    *I only saw where he tried the Advair Diskus but medicaid is saying he needs to have tried 2 of the preferred first.

## 2024-02-15 MED ORDER — BUDESONIDE-FORMOTEROL FUMARATE 160-4.5 MCG/ACT IN AERO: 2.0000 | INHALATION_SPRAY | Freq: Two times a day (BID) | RESPIRATORY_TRACT | 2 refills | Status: DC

## 2024-02-15 NOTE — Telephone Encounter (Signed)
 LMOVM of the providers message, informed any questions to give the office a call back.

## 2024-02-15 NOTE — Telephone Encounter (Signed)
 Please advise patient that his insurance requires a trelegy of Symbicort  before they will pay for Trelegy. Have sent prescription for symbicort  to [facility].

## 2024-02-15 NOTE — Addendum Note (Signed)
 Addended by: GASPER NANCYANN BRAVO on: 02/15/2024 02:09 PM   Modules accepted: Orders

## 2024-02-20 ENCOUNTER — Telehealth: Payer: Self-pay | Admitting: Family Medicine

## 2024-02-20 NOTE — Telephone Encounter (Signed)
 Patient dropped off paperwork from Attorney's office to be completed.   They are being sent back for completion.  Call patient when ready.

## 2024-02-21 NOTE — Telephone Encounter (Signed)
 Spoke with patient, states he was denied disability and he is having a Clinical research associate appeal this. The forms are needing documentation for arthritis in knees/ hands (pt states he will see ortho again soon), headaches and HTN. States they will appeal the decision and if that is denied they will have a hearing.

## 2024-02-21 NOTE — Telephone Encounter (Signed)
 I have no idea what medical problem these forms are for. Please find out from patient why these were sent to me.

## 2024-02-22 NOTE — Telephone Encounter (Signed)
 Will discuss at 02-23-24 o.v.

## 2024-02-23 ENCOUNTER — Ambulatory Visit (INDEPENDENT_AMBULATORY_CARE_PROVIDER_SITE_OTHER): Admitting: Family Medicine

## 2024-02-23 ENCOUNTER — Encounter: Payer: Self-pay | Admitting: Family Medicine

## 2024-02-23 VITALS — BP 133/97 | HR 88 | Temp 98.0°F | Ht 68.0 in | Wt 165.5 lb

## 2024-02-23 DIAGNOSIS — R7303 Prediabetes: Secondary | ICD-10-CM | POA: Diagnosis not present

## 2024-02-23 DIAGNOSIS — G43819 Other migraine, intractable, without status migrainosus: Secondary | ICD-10-CM

## 2024-02-23 DIAGNOSIS — E781 Pure hyperglyceridemia: Secondary | ICD-10-CM | POA: Diagnosis not present

## 2024-02-23 DIAGNOSIS — I10 Essential (primary) hypertension: Secondary | ICD-10-CM | POA: Diagnosis not present

## 2024-02-23 MED ORDER — AMITRIPTYLINE HCL 10 MG PO TABS: 10.0000 mg | ORAL_TABLET | Freq: Every day | ORAL | 1 refills | Status: DC

## 2024-02-23 NOTE — Progress Notes (Signed)
 Established patient visit   Patient: Justin Olsen   DOB: 26-Feb-1966   58 y.o. Male  MRN: [id number] Visit Date: 02/23/2024  Today's healthcare provider: Nancyann Perry, MD   Chief Complaint  Patient presents with   Migraine    Patient reports that he is here to follow up on migraines, he stated that the prescription is no longer working.  He stated that it starts on the right side of his face around his temple and then radiates all down the side of his face.   Subjective    Discussed the use of AI scribe software for clinical note transcription with the patient, who gave verbal consent to proceed.  History of Present Illness   Justin Olsen is a 58 year old male with hypertension, chronic headaches, and emphysema who presents for follow-up.  His headaches had previously greatly improved as his blood pressure came under control on diltiazem  and hctz. However, he is experiencing an increase in the frequency of his headaches over the last 4-5 months, which now occur almost daily. The headaches are localized to the right side of his head and radiate in a circular pattern. He describes a sensation of swelling and a pulsating vein or artery. These headaches sometimes wake him at night and can last for a couple of days. He finds some relief with hot or cold rags and BC powder. He restarted gabapentin  600 mg nightly a couple of months ago due to the recurrence of headaches, but they persist. He has not tried amitriptyline  or nortriptyline before. He states headaches he is now having are not identical to headaches he had in the past.   His hypertension is currently managed with diltiazem  and hctz, and his blood pressure readings have been stable, although the diastolic number is occasionally higher than usual. He is also taking rosuvastatin  for cholesterol management.  Regarding his emphysema, he was previously on Trelegy for COPD, but due to insurance requirements, he had to switch to  Symbicort .   He continues on rosuvastatin  for hyperlipidemia but was noted to have high triglycerides on last labs 6 months ago. He reports he is taking rosuvastatin  consistently every day. He also has prediabetes and is due to check A1c.   He is in the process of applying for disability, which includes his headaches and high blood pressure as part of the case. He is working with Dr. Kathlynn for his disability paperwork and has involved a lawyer due to difficulties in finding employment, particularly in [employer], given his age and medical conditions.  He mentioned that he does not usually eat during the day, preferring to eat at night, which was a habit from his working days.       Medications: Outpatient Medications Prior to Visit  Medication Sig   albuterol  (VENTOLIN  HFA) 108 (90 Base) MCG/ACT inhaler Inhale 2 puffs into the lungs every 6 (six) hours as needed for wheezing or shortness of breath.   ASPIRIN 81 PO Take by mouth daily.   Aspirin-Salicylamide-Caffeine (BC HEADACHE POWDER PO) Take by mouth 2 (two) times daily as needed.   budesonide -formoterol  (SYMBICORT ) 160-4.5 MCG/ACT inhaler Inhale 2 puffs into the lungs 2 (two) times daily.   clonazePAM  (KLONOPIN ) 1 MG tablet TAKE A HALF TO 1 TABLET BY MOUTH AT BEDTIME   diltiazem  (CARDIZEM  CD) 300 MG 24 hr capsule TAKE 1 CAPSULE(300 MG) BY MOUTH DAILY   gabapentin  (NEURONTIN ) 600 MG tablet Take 600 mg by mouth at bedtime.  hydrochlorothiazide  (HYDRODIURIL ) 25 MG tablet TAKE 1 TABLET BY MOUTH DAILY   naproxen  (NAPROSYN ) 500 MG tablet Take 1 tablet (500 mg total) by mouth 2 (two) times daily as needed.   pantoprazole  (PROTONIX ) 40 MG tablet TAKE 1 TABLET BY MOUTH DAILY   potassium chloride  (KLOR-CON ) 10 MEQ tablet Take 1 tablet (10 mEq total) by mouth daily.   rosuvastatin  (CRESTOR ) 20 MG tablet Take 1 tablet (20 mg total) by mouth daily.   No facility-administered medications prior to visit.   Review of Systems  Constitutional:   Negative for appetite change, chills and fever.  Respiratory:  Negative for chest tightness, shortness of breath and wheezing.   Cardiovascular:  Negative for chest pain and palpitations.  Gastrointestinal:  Negative for abdominal pain, nausea and vomiting.       Objective    BP (!) 133/97 (BP Location: Left Arm, Patient Position: Sitting, Cuff Size: Normal)   Pulse 88   Temp 98 F (36.7 C) (Oral)   Ht 5' 8 (1.727 m)   Wt 165 lb 8 oz (75.1 kg)   SpO2 98%   BMI 25.16 kg/m   Physical Exam   General: Appearance:    Well developed, well nourished male in no acute distress  Eyes:    PERRL, conjunctiva/corneas clear, EOM's intact       Lungs:     Clear to auscultation bilaterally, respirations unlabored  Heart:    Normal heart rate. Normal rhythm. No murmurs, rubs, or gallops.    MS:   All extremities are intact.    Neurologic:   Awake, alert, oriented x 3. No apparent focal neurological defect.          Assessment & Plan    1. Primary hypertension (Primary) Fairly well controlled on current medication regiment.   2. Other migraine without status migrainosus, intractable Not similar to past headaches that were associated with advanced hypertension, which mostly resolved when his BP was treated.   Start amitriptyline  (ELAVIL ) 10 MG tablet; Take 1 tablet (10 mg total) by mouth at bedtime. Increase to 2 tablets at bedtime after a week  Dispense: 60 tablet; Refill: 1 - MR Brain W Wo Contrast; Future  Headaches are severe and render patient unable to work due to impairment of concentration and exacerbation of headaches with physical exertion.   3. Prediabetes  - Hemoglobin A1c  4. Hypertriglyceridemia He is tolerating rosuvastatin  well with no adverse effects.    - Lipid panel   Return in about 4 weeks (around 03/22/2024).  For follow up of headaches.      Nancyann Perry, MD  [facility] [phone] (phone) [phone] (fax)  [facility]

## 2024-02-23 NOTE — Patient Instructions (Signed)
 SABRA  Please review the attached list of medications and notify my office if there are any errors.   . Please bring all of your medications to every appointment so we can make sure that our medication list is the same as yours.

## 2024-02-24 LAB — LIPID PANEL
Chol/HDL Ratio: 6.2 ratio — ABNORMAL HIGH (ref 0.0–5.0)
Cholesterol, Total: 192 mg/dL (ref 100–199)
HDL: 31 mg/dL — ABNORMAL LOW (ref >39)
LDL Chol Calc (NIH): 62 mg/dL (ref 0–99)
Triglycerides: 651 mg/dL (ref 0–149)
VLDL Cholesterol Cal: 99 mg/dL — ABNORMAL HIGH (ref 5–40)

## 2024-02-24 LAB — HEMOGLOBIN A1C
Est. average glucose Bld gHb Est-mCnc: 120 mg/dL
Hgb A1c MFr Bld: 5.8 % — ABNORMAL HIGH (ref 4.8–5.6)

## 2024-02-29 ENCOUNTER — Ambulatory Visit: Payer: Self-pay | Admitting: Family Medicine

## 2024-02-29 DIAGNOSIS — E781 Pure hyperglyceridemia: Secondary | ICD-10-CM | POA: Insufficient documentation

## 2024-03-06 ENCOUNTER — Ambulatory Visit: Admitting: Family Medicine

## 2024-03-06 DIAGNOSIS — Z0279 Encounter for issue of other medical certificate: Secondary | ICD-10-CM

## 2024-03-06 NOTE — Telephone Encounter (Signed)
 Forms completed and sent to medical records to be faxed.

## 2024-03-27 ENCOUNTER — Other Ambulatory Visit: Payer: Self-pay | Admitting: Orthopedic Surgery

## 2024-03-27 DIAGNOSIS — R200 Anesthesia of skin: Secondary | ICD-10-CM

## 2024-03-30 ENCOUNTER — Ambulatory Visit
Admission: RE | Admit: 2024-03-30 | Discharge: 2024-03-30 | Disposition: A | Discharge status: Home / Self Care | Source: Ambulatory Visit | Attending: Orthopedic Surgery | Admitting: Orthopedic Surgery

## 2024-03-30 DIAGNOSIS — R202 Paresthesia of skin: Secondary | ICD-10-CM | POA: Insufficient documentation

## 2024-03-30 DIAGNOSIS — R200 Anesthesia of skin: Secondary | ICD-10-CM | POA: Insufficient documentation

## 2024-04-01 ENCOUNTER — Encounter: Payer: Self-pay | Admitting: Family Medicine

## 2024-04-01 ENCOUNTER — Ambulatory Visit (INDEPENDENT_AMBULATORY_CARE_PROVIDER_SITE_OTHER): Admitting: Family Medicine

## 2024-04-01 VITALS — BP 135/97 | HR 85 | Ht 68.0 in | Wt 166.1 lb

## 2024-04-01 DIAGNOSIS — Z23 Encounter for immunization: Secondary | ICD-10-CM | POA: Diagnosis not present

## 2024-04-01 DIAGNOSIS — M5090 Cervical disc disorder, unspecified, unspecified cervical region: Secondary | ICD-10-CM | POA: Diagnosis not present

## 2024-04-01 DIAGNOSIS — E781 Pure hyperglyceridemia: Secondary | ICD-10-CM | POA: Diagnosis not present

## 2024-04-01 DIAGNOSIS — H16132 Photokeratitis, left eye: Secondary | ICD-10-CM

## 2024-04-01 DIAGNOSIS — G43819 Other migraine, intractable, without status migrainosus: Secondary | ICD-10-CM

## 2024-04-01 MED ORDER — AMITRIPTYLINE HCL 10 MG PO TABS: ORAL_TABLET | ORAL | Status: DC

## 2024-04-01 NOTE — Patient Instructions (Signed)
 SABRA  Please review the attached list of medications and notify my office if there are any errors.   . Please bring all of your medications to every appointment so we can make sure that our medication list is the same as yours.

## 2024-04-01 NOTE — Progress Notes (Signed)
 Established patient visit   Patient: Justin Olsen   DOB: [birth year]   58 y.o. Male  MRN: [id number] Visit Date: 04/01/2024  Today's healthcare provider: Nancyann Perry, MD   Chief Complaint  Patient presents with   Follow-up    headaches   Subjective    Discussed the use of AI scribe software for clinical note transcription with the patient, who gave verbal consent to proceed.  History of Present Illness   Justin Olsen is a 58 year old male with chronic migraine and hypertension who presents for follow-up of his headaches.  He was last seen on September 5th with worsening migraines and was started on 10 mg of amitriptyline . He has stopped taking gabapentin  a couple of weeks ago and is currently taking two tablets of amitriptyline  every night. His headaches occur daily or with a gap of a few days, with severity increasing during stressful periods, such as the recent passing of his wife's mother and his father's stroke.  He states that a recent MRI ordered by Dr. Vinson showed multiple partially and complete collapsed vertebra that he thinks are contributing to his headaches.   Regarding his hyperlipidemia, his last labs showed very high triglycerides at 651 and a low HDL of 31, leading to an increase in his rosuvastatin  dose from 20 mg to 40 mg daily.  He mentions having skin concerns, including spots on his ears and arms, described as 'almost like a blister' that can be popped but then return. He has a history of shingles but notes these spots do not hurt in the same way.  He has been experiencing significant stress due to family health issues, including his father's stroke and the need to travel frequently. He notes difficulty sleeping, often not falling asleep until after 2 AM, despite taking clozapine. No recognized side effects from amitriptyline .       Medications: Outpatient Medications Prior to Visit  Medication Sig   albuterol  (VENTOLIN  HFA) 108 (90 Base)  MCG/ACT inhaler Inhale 2 puffs into the lungs every 6 (six) hours as needed for wheezing or shortness of breath.   ASPIRIN 81 PO Take by mouth daily.   Aspirin-Salicylamide-Caffeine (BC HEADACHE POWDER PO) Take by mouth 2 (two) times daily as needed.   budesonide -formoterol  (SYMBICORT ) 160-4.5 MCG/ACT inhaler Inhale 2 puffs into the lungs 2 (two) times daily.   clonazePAM  (KLONOPIN ) 1 MG tablet TAKE A HALF TO 1 TABLET BY MOUTH AT BEDTIME   diltiazem  (CARDIZEM  CD) 300 MG 24 hr capsule TAKE 1 CAPSULE(300 MG) BY MOUTH DAILY   hydrochlorothiazide  (HYDRODIURIL ) 25 MG tablet TAKE 1 TABLET BY MOUTH DAILY   naproxen  (NAPROSYN ) 500 MG tablet Take 1 tablet (500 mg total) by mouth 2 (two) times daily as needed.   pantoprazole  (PROTONIX ) 40 MG tablet TAKE 1 TABLET BY MOUTH DAILY   rosuvastatin  (CRESTOR ) 20 MG tablet Take 1 tablet (20 mg total) by mouth daily.   amitriptyline  (ELAVIL ) 10 MG tablet Take 1 tablet (10 mg total) by mouth at bedtime. Increase to 2 tablets at bedtime after a week   potassium chloride  (KLOR-CON ) 10 MEQ tablet Take 1 tablet (10 mEq total) by mouth daily. (Patient not taking: Reported on 04/01/2024)   [DISCONTINUED] gabapentin  (NEURONTIN ) 600 MG tablet Take 600 mg by mouth at bedtime. (Patient not taking: Reported on 04/01/2024)   No facility-administered medications prior to visit.        Objective    BP (!) 135/97 (BP Location: Left Arm,  Patient Position: Sitting, Cuff Size: Normal)   Pulse 85   Ht 5' 8 (1.727 m)   Wt 166 lb 1.6 oz (75.3 kg)   SpO2 96%   BMI 25.26 kg/m   Physical Exam   General appearance: Well developed, well nourished male, cooperative and in no acute distress Head: Normocephalic, without obvious abnormality, atraumatic Respiratory: Respirations even and unlabored, normal respiratory rate Extremities: All extremities are intact.  Skin: Skin color, texture, turgor normal. No rashes seen  Psych: Appropriate mood and affect. Neurologic: Mental  status: Alert, oriented to person, place, and time, thought content appropriate.     Assessment & Plan        Chronic migraine Chronic migraines with variable frequency and severity. Headaches possibly linked to nerve pain from vertebral collapse and cervical spurs. Stress and family events may increase frequency. No amitriptyline  side effects reported. - Increase amitriptyline  to 30 mg daily, two tablets twice a day. - Schedule follow-up after the first of the year to reassess headache management and post-surgical outcomes.  Pure hyperglyceridemia Previously noted very high triglycerides (651 mg/dL) and low HDL (31 mg/dL). Rosuvastatin  increased to 40 mg daily on September 5th.  Actinic keratosis Actinic keratosis on arms and chest. Lesions not cancerous but may progress to skin cancer. - Refer to dermatologist for evaluation and management.     Return in about 4 months (around 08/02/2024) for Hypertension. And hypertrigleridemia.      Nancyann Perry, MD  [facility] [phone] (phone) [phone] (fax)  [facility]

## 2024-04-09 ENCOUNTER — Other Ambulatory Visit: Payer: Self-pay | Admitting: Family Medicine

## 2024-04-09 DIAGNOSIS — E7800 Pure hypercholesterolemia, unspecified: Secondary | ICD-10-CM

## 2024-04-09 MED ORDER — ROSUVASTATIN CALCIUM 40 MG PO TABS: 40.0000 mg | ORAL_TABLET | Freq: Every day | ORAL | 2 refills | Status: AC

## 2024-04-11 ENCOUNTER — Ambulatory Visit (INDEPENDENT_AMBULATORY_CARE_PROVIDER_SITE_OTHER)

## 2024-04-11 DIAGNOSIS — D229 Melanocytic nevi, unspecified: Secondary | ICD-10-CM

## 2024-04-11 DIAGNOSIS — L821 Other seborrheic keratosis: Secondary | ICD-10-CM | POA: Diagnosis not present

## 2024-04-11 DIAGNOSIS — L814 Other melanin hyperpigmentation: Secondary | ICD-10-CM | POA: Diagnosis not present

## 2024-04-11 DIAGNOSIS — L820 Inflamed seborrheic keratosis: Secondary | ICD-10-CM | POA: Diagnosis not present

## 2024-04-11 DIAGNOSIS — D1801 Hemangioma of skin and subcutaneous tissue: Secondary | ICD-10-CM

## 2024-04-11 DIAGNOSIS — L578 Other skin changes due to chronic exposure to nonionizing radiation: Secondary | ICD-10-CM

## 2024-04-11 DIAGNOSIS — Z1283 Encounter for screening for malignant neoplasm of skin: Secondary | ICD-10-CM | POA: Diagnosis not present

## 2024-04-11 NOTE — Progress Notes (Signed)
 Subjective   Justin Olsen is a 58 y.o. male who presents for the following: Total body skin exam for skin cancer screening and mole check. The patient has spots, moles and lesions to be evaluated, some may be new or changing and the patient may have concern these could be cancer.. Patient is new patient  Today patient reports: Area of concern on the chest Areas of concern on bilateral upper extremities   Review of Systems:    No other skin or systemic complaints except as noted in HPI or Assessment and Plan.  The following portions of the chart were reviewed this encounter and updated as appropriate: medications, allergies, medical history  Relevant Medical History:  Family history of skin cancer - Grandmother, grandfather, uncle, aunt; unsure of types   Objective  Well appearing patient in no apparent distress; mood and affect are within normal limits. Examination was performed of the: Full Skin Examination: scalp, head, eyes, ears, nose, lips, neck, chest, axillae, abdomen, back, buttocks, bilateral upper extremities, bilateral lower extremities, hands, feet, fingers, toes, fingernails, and toenails.   Examination notable for: SKIN EXAM, Angioma(s): Scattered red vascular papule(s)  , Lentigo/lentigines: Scattered pigmented macules that are tan to brown in color and are somewhat non-uniform in shape and concentrated in the sun-exposed areas, Nevus/nevi: Scattered well-demarcated, regular, pigmented macule(s) and/or papule(s)  , Seborrheic Keratosis(es): Stuck-on appearing keratotic papule(s) on the trunk, some  irritated with redness, crusting, edema, and/or partial avulsion, Actinic Damage/Elastosis: chronic sun damage: dyspigmentation, telangiectasia, and wrinkling  Examination limited by: Undergarments and Patient deferred removal     Upper extremities (2) Stuck on waxy paps with erythema  Assessment & Plan   SKIN CANCER SCREENING PERFORMED TODAY.  BENIGN SKIN FINDINGS  -  Lentigines  - Seborrheic keratoses  - Hemangiomas   - Nevus/Multiple Benign Nevi  - Reassurance provided regarding the benign appearance of lesions noted on exam today; no treatment is indicated in the absence of symptoms/changes. - Reinforced importance of photoprotective strategies including liberal and frequent sunscreen use of a broad-spectrum SPF 30 or greater, use of protective clothing, and sun avoidance for prevention of cutaneous malignancy and photoaging.  Counseled patient on the importance of regular self-skin monitoring as well as routine clinical skin examinations as scheduled.   ACTINIC DAMAGE - Chronic condition, secondary to cumulative UV/sun exposure - Recommend daily broad spectrum sunscreen SPF 30+ to sun-exposed areas, reapply every 2 hours as needed.  - Staying in the shade or wearing long sleeves, sun glasses (UVA+UVB protection) and wide brim hats (4-inch brim around the entire circumference of the hat) are also recommended for sun protection.  - Call for new or changing lesions.  Level of service outlined above   Procedures, orders, diagnosis for this visit:  INFLAMED SEBORRHEIC KERATOSIS (2) Upper extremities (2) Symptomatic, irritating, patient would like treated. Destruction of lesion - Upper extremities (2) Complexity: simple   Destruction method: cryotherapy   Informed consent: discussed and consent obtained   Timeout:  patient name, date of birth, surgical site, and procedure verified Lesion destroyed using liquid nitrogen: Yes   Region frozen until ice ball extended beyond lesion: Yes   Cryo cycles: 1 or 2. Outcome: patient tolerated procedure well with no complications   Post-procedure details: wound care instructions given     Inflamed seborrheic keratosis -     Destruction of lesion    Return to clinic: Return in about 1 year (around 04/11/2025) for TBSE.  I, Emerick Ege, CMA  am acting as scribe for Lauraine JAYSON Kanaris, MD   Documentation: I  have reviewed the above documentation for accuracy and completeness, and I agree with the above.  Lauraine JAYSON Kanaris, MD

## 2024-04-11 NOTE — Patient Instructions (Addendum)
 Cryotherapy Aftercare  Wash gently with soap and water everyday.   Apply Vaseline and Band-Aid daily until healed.   Sunscreen  Who needs sunscreen? Everyone. Sunscreen use can help prevent skin cancer by protecting you from the sun's harmful ultraviolet rays. Anyone can get skin cancer, regardless of age, gender or race. In fact, it is estimated that one in five Americans will develop skin cancer in their lifetime.  Sunscreen alone cannot fully protect you. In addition to wearing sunscreen, dermatologists recommend taking the following steps to protect your skin and find skin cancer early:  Seek shade when appropriate, remembering that the sun's rays are strongest between 10 a.m. and 2 p.m. If your shadow is shorter than you are, seek shade. Dress to protect yourself from the sun by wearing a lightweight long-sleeved shirt, pants, a wide-brimmed hat and sunglasses, when possible.  Use extra caution near water, snow and sand as they reflect the damaging rays of the sun, which can increase your chance of sunburn.  Get vitamin D  safely through a healthy diet that may include vitamin supplements. Don't seek the sun. Avoid tanning beds. Ultraviolet light from the sun and tanning beds can cause skin cancer and wrinkling. If you want to look tan, you may wish to use a self-tanning product, but continue to use sunscreen with it.  When should I use sunscreen? Every day you go outside--even if you're just walking to and from your form of transportation. The sun emits harmful UV rays year-round. Even on cloudy days, up to 80 percent of the sun's harmful UV rays can penetrate your skin. Snow, sand and water increase the need for sunscreen because they reflect the sun's rays.  How much sunscreen should I use, and how often should I apply it? Most people only apply 25-50 percent of the recommended amount of sunscreen. Apply enough sunscreen to cover all exposed skin. Most adults need about 1 ounce -- or enough  to fill a shot glass -- to fully cover their body.  Don't forget to apply to the tops of your feet, your neck, your ears and the top of your head. Apply sunscreen to dry skin 15 minutes before going outdoors.  Skin cancer also can form on the lips. To protect your lips, apply a lip balm or lipstick that contains sunscreen with an SPF of 30 or higher.  When outdoors, reapply sunscreen approximately every two hours, or after swimming or sweating, according to the directions on the bottle.   Broad-spectrum sunscreens protect against both UVA and UVB rays. What is the difference between the rays? Sunlight consists of two types of harmful rays that reach the earth -- UVA rays and UVB rays. Overexposure to either can lead to skin cancer. In addition to causing skin cancer, here's what each of these rays do:  UVA rays (or aging rays) can prematurely age your skin, causing wrinkles and age spots, and can pass through window glass. UVB rays (or burning rays) are the primary cause of sunburn and are blocked by window glass  There is no safe way to tan. Every time you tan, you damage your skin. As this damage builds, you speed up the aging of your skin and increase your risk for all types of skin cancer.  What is the difference between chemical and physical sunscreens? Chemical sunscreens work like a sponge, absorbing the sun's rays. They contain one or more of the following active ingredients: oxybenzone, avobenzone, octisalate, octocrylene, homosalate and octinoxate. These formulations tend  to be easier to rub into the skin without leaving a white residue.   Physical sunscreens work like a shield, sitting sit on the surface of your skin and deflecting the sun's rays. They contain the active ingredients zinc oxide and/or titanium dioxide. Use this sunscreen if you have sensitive skin.   What type of sunscreen should I use? The best type of sunscreen is the one you will use again and again. Just make sure it  offers broad-spectrum (UVA and UVB) protection, has an SPF of 30+, and is water-resistant. The kind of sunscreen you use is a matter of personal choice, and may vary depending on the area of the body to be protected. Available sunscreen options include lotions, creams, gels, ointments, wax sticks and sprays.  Recommended physical sunscreens for face: - Neutrogena Sheer Zinc - Aveeno Positively Mineral Sensitive - CeraVe Hydrating Mineral (also has a tinted version) - La Roche-Posay Anthelios Mineral Face (comes as a cream, lotion, light fluid, and there is also a tinted version).  - EltaMD UV Clear (also has a tinted version)  Recommended physical sunscreens for body: - Neutrogena Sheer Zinc Dry-Touch Sunscreen Sensitive Skin Lotion Broad Spectrum SPF 50 - Aveeno Positively Mineral Sensitive Skin Sunscreen Broad Spectrum SPF 50 - La Roche-Posay Anthelios SPF 50 Mineral Sunscreen - Gentle Lotion - CeraVe Hydrating Mineral Sunscreen SPF 50  Recommended chemical sunscreens for face: - Anthelios UV Correct Face Sunscreen SPF 70 with Niacinamide - Neutrogena Clear Face Oil-Free SPF 50 with Helioplex - Neutrogena Sport Face Oil-Free SPF 70+ with Helioplex - Aveeno Protect + Hydrate Sunscreen For Face SPF 70 - La Roche-Posay Anthelios Light Fluid Sunscreen for Face SPF 60  Recommended chemical sunscreens for body: - Neutrogena Ultra Sheer Dry-Touch Sunscreen SPF 70 - Aveeno Protect + Hydrate Broad Spectrum All-Day Hydration SPF 60 (comes in a big pump) - La Roche-Posay Anthelios Melt-In Milk Sunscreen SPF 60   Melanoma ABCDEs  Melanoma is the most dangerous type of skin cancer, and is the leading cause of death from skin disease.  You are more likely to develop melanoma if you: Have light-colored skin, light-colored eyes, or red or blond hair Spend a lot of time in the sun Tan regularly, either outdoors or in a tanning bed Have had blistering sunburns, especially during childhood Have a  close family member who has had a melanoma Have atypical moles or large birthmarks  Early detection of melanoma is key since treatment is typically straightforward and cure rates are extremely high if we catch it early.   The first sign of melanoma is often a change in a mole or a new dark spot.  The ABCDE system is a way of remembering the signs of melanoma.  A for asymmetry:  The two halves do not match. B for border:  The edges of the growth are irregular. C for color:  A mixture of colors are present instead of an even brown color. D for diameter:  Melanomas are usually (but not always) greater than 6mm - the size of a pencil eraser. E for evolution:  The spot keeps changing in size, shape, and color.  Please check your skin once per month between visits. You can use a small mirror in front and a large mirror behind you to keep an eye on the back side or your body.   If you see any new or changing lesions before your next follow-up, please call to schedule a visit.  Please continue daily skin protection including  broad spectrum sunscreen SPF 30+ to sun-exposed areas, reapplying every 2 hours as needed when you're outdoors.     Due to recent changes in healthcare laws, you may see results of your pathology and/or laboratory studies on MyChart before the doctors have had a chance to review them. We understand that in some cases there may be results that are confusing or concerning to you. Please understand that not all results are received at the same time and often the doctors may need to interpret multiple results in order to provide you with the best plan of care or course of treatment. Therefore, we ask that you please give us  2 business days to thoroughly review all your results before contacting the office for clarification. Should we see a critical lab result, you will be contacted sooner.   If You Need Anything After Your Visit  If you have any questions or concerns for your  doctor, please call our main line at [phone] and press option 4 to reach your doctor's medical assistant. If no one answers, please leave a voicemail as directed and we will return your call as soon as possible. Messages left after 4 pm will be answered the following business day.   You may also send us  a message via MyChart. We typically respond to MyChart messages within 1-2 business days.  For prescription refills, please ask your pharmacy to contact our office. Our fax number is [phone].  If you have an urgent issue when the clinic is closed that cannot wait until the next business day, you can page your doctor at the number below.    Please note that while we do our best to be available for urgent issues outside of office hours, we are not available 24/7.   If you have an urgent issue and are unable to reach us , you may choose to seek medical care at your doctor's office, retail clinic, urgent care center, or emergency room.  If you have a medical emergency, please immediately call 911 or go to the emergency department.  Pager Numbers  - Dr. Hester: [phone]  - Dr. Jackquline: [phone]  - Dr. Claudene: [phone]   - Dr. Raymund: [phone]  In the event of inclement weather, please call our main line at [phone] for an update on the status of any delays or closures.  Dermatology Medication Tips: Please keep the boxes that topical medications come in in order to help keep track of the instructions about where and how to use these. Pharmacies typically print the medication instructions only on the boxes and not directly on the medication tubes.   If your medication is too expensive, please contact our office at [phone] option 4 or send us  a message through MyChart.   We are unable to tell what your co-pay for medications will be in advance as this is different depending on your insurance coverage. However, we may be able to find a substitute medication at  lower cost or fill out paperwork to get insurance to cover a needed medication.   If a prior authorization is required to get your medication covered by your insurance company, please allow us  1-2 business days to complete this process.  Drug prices often vary depending on where the prescription is filled and some pharmacies may offer cheaper prices.  The website www.goodrx.com contains coupons for medications through different pharmacies. The prices here do not account for what the cost may be with help from insurance (it may be cheaper with your  insurance), but the website can give you the price if you did not use any insurance.  - You can print the associated coupon and take it with your prescription to the pharmacy.  - You may also stop by our office during regular business hours and pick up a GoodRx coupon card.  - If you need your prescription sent electronically to a different pharmacy, notify our office through [facility] or by phone at [phone] option 4.     Si Usted Necesita Algo Despus de Su Visita  Tambin puede enviarnos un mensaje a travs de Clinical cytogeneticist. Por lo general respondemos a los mensajes de MyChart en el transcurso de 1 a 2 das hbiles.  Para renovar recetas, por favor pida a su farmacia que se ponga en contacto con nuestra oficina. Randi lakes de fax es [place] [phone].  Si tiene un asunto urgente cuando la clnica est cerrada y que no puede esperar hasta el siguiente da hbil, puede llamar/localizar a su doctor(a) al nmero que aparece a continuacin.   Por favor, tenga en cuenta que aunque hacemos todo lo posible para estar disponibles para asuntos urgentes fuera del horario de [place], no estamos disponibles las 24 horas del da, los 7 [address]  [address] de la [place].   Si tiene un problema urgente y no puede comunicarse con nosotros, puede optar por buscar atencin mdica  en el consultorio de su doctor(a), en una clnica privada, en un centro de atencin urgente  o en una sala de emergencias.  Si tiene Engineer, drilling, por favor llame inmediatamente al 911 o vaya a la sala de emergencias.  Nmeros de bper  - Dr. Hester: [phone]  - Dra. Jackquline: [phone]  - Dr. Claudene: [phone]  - Dra. Kitts: [phone]  En caso de inclemencias del Universal, por favor llame a nuestra lnea principal al [phone] para una actualizacin sobre el estado de cualquier retraso o cierre.  Consejos para la medicacin en dermatologa: Por favor, guarde las cajas en las que vienen los medicamentos de uso tpico para ayudarle a seguir las instrucciones sobre dnde y cmo usarlos. Las farmacias generalmente imprimen las instrucciones del medicamento slo en las cajas y no directamente en los tubos del [place].   Si su medicamento es muy caro, por favor, pngase en contacto con landry rieger llamando al [phone] y presione la opcin 4 o envenos un mensaje a travs de Clinical cytogeneticist.   No podemos decirle cul ser su copago por los medicamentos por adelantado ya que esto es diferente dependiendo de la cobertura de su seguro. Sin embargo, es posible que podamos encontrar un medicamento sustituto a Audiological scientist un formulario para que el seguro cubra el medicamento que se considera necesario.   Si se requiere una autorizacin previa para que su compaa de seguros malta su medicamento, por favor permtanos de 1 a 2 das hbiles para completar este proceso.  Los precios de los medicamentos varan con frecuencia dependiendo del Environmental consultant de dnde se surte la receta y alguna farmacias pueden ofrecer precios ms baratos.  El sitio web www.goodrx.com tiene cupones para medicamentos de Health and safety inspector. Los precios aqu no tienen en cuenta lo que podra costar con la ayuda del seguro (puede ser ms barato con su seguro), pero el sitio web puede darle el precio si no utiliz Tourist information centre manager.  - Puede imprimir el cupn correspondiente y llevarlo con su  receta a la farmacia.  - Tambin puede pasar por nuestra oficina durante el horario de atencin regular  y recoger una tarjeta de cupones de GoodRx.  - Si necesita que su receta se enve electrnicamente a una farmacia diferente, informe a nuestra oficina a travs de MyChart de [place] o por telfono llamando al [phone] y presione la opcin 4.

## 2024-04-13 ENCOUNTER — Other Ambulatory Visit: Payer: Self-pay | Admitting: Family Medicine

## 2024-04-13 DIAGNOSIS — I10 Essential (primary) hypertension: Secondary | ICD-10-CM

## 2024-04-13 DIAGNOSIS — G8929 Other chronic pain: Secondary | ICD-10-CM

## 2024-04-15 ENCOUNTER — Other Ambulatory Visit: Payer: Self-pay | Admitting: Family Medicine

## 2024-04-15 DIAGNOSIS — G43819 Other migraine, intractable, without status migrainosus: Secondary | ICD-10-CM

## 2024-04-23 ENCOUNTER — Telehealth: Payer: Self-pay | Admitting: Family Medicine

## 2024-04-23 NOTE — Telephone Encounter (Signed)
 Patient reports it was the automated system that they have but they are aware that his insurance is not covering it. Please disregard.

## 2024-04-23 NOTE — Telephone Encounter (Signed)
 Trelegy was changed to Symbicort  in August due to insurance formulary. Why are they requesting Trelegy now? Is there a problem with Symbicort .

## 2024-04-26 NOTE — Progress Notes (Deleted)
 Referring Physician:  Carlisle Benton CROME, FNP [address] [place],  [place] [address]  Primary Physician:  Gasper Nancyann BRAVO, MD  History of Present Illness: 04/26/2024 Mr. Diyari Gaida is here today with a chief complaint of ***  cervical spine pain with extension to the trapezius ridge to the shoulder with daily intermittent arm numbness and tingling.   Duration: *** Location: *** Quality: *** Severity: ***  Precipitating: aggravated by *** Modifying factors: made better by *** Weakness: none Timing: *** Bowel/Bladder Dysfunction: none  Conservative measures:  Physical therapy: *** Attempted PT at [facility] and was unable to continue due to cervical instability Multimodal medical therapy including regular antiinflammatories: *** naproxen  Injections: no epidural steroid injections  Past Surgery: ***none  Markis Langland Uhlig has ***no symptoms of cervical myelopathy.  The symptoms are causing a significant impact on the patient's life.   I have utilized the care everywhere function in epic to review the outside records available from external health systems.  Review of Systems:  A 10 point review of systems is negative, except for the pertinent positives and negatives detailed in the HPI.  Past Medical History: Past Medical History:  Diagnosis Date   Aortic atherosclerosis    CAD (coronary artery disease)    Emphysema lung (HCC)    Emphysema of lung (HCC)    Essential hypertension    GERD (gastroesophageal reflux disease)    Headache    Wears dentures    full upper and lower    Past Surgical History: Past Surgical History:  Procedure Laterality Date   CARPOMETACARPAL (CMC) FUSION OF THUMB Right 09/06/2022   Procedure: Right thumb carpometacarpal arthroplasty;  Surgeon: Kathlynn Sharper, MD;  Location: [facility];  Service: Orthopedics;  Laterality: Right;    Allergies: Allergies as of 05/01/2024 - Review Complete 04/11/2024  Allergen Reaction  Noted   Lisinopril   03/11/2022    Medications:  Current Outpatient Medications:    albuterol  (VENTOLIN  HFA) 108 (90 Base) MCG/ACT inhaler, Inhale 2 puffs into the lungs every 6 (six) hours as needed for wheezing or shortness of breath., Disp: 8.5 g, Rfl: 3   amitriptyline  (ELAVIL ) 10 MG tablet, TAKE 1 TABLET BY MOUTH AT BEDTIME, MAY INCREASE TO TAKE 2 TABLETS BY MOUTH AT BEDTIME AFTER ONE WEEK, Disp: 120 tablet, Rfl: 3   ASPIRIN 81 PO, Take by mouth daily., Disp: , Rfl:    Aspirin-Salicylamide-Caffeine (BC HEADACHE POWDER PO), Take by mouth 2 (two) times daily as needed., Disp: , Rfl:    budesonide -formoterol  (SYMBICORT ) 160-4.5 MCG/ACT inhaler, Inhale 2 puffs into the lungs 2 (two) times daily., Disp: 1 each, Rfl: 2   clonazePAM  (KLONOPIN ) 1 MG tablet, TAKE A HALF TO 1 TABLET BY MOUTH AT BEDTIME, Disp: 30 tablet, Rfl: 3   diltiazem  (CARDIZEM  CD) 300 MG 24 hr capsule, TAKE 1 CAPSULE BY MOUTH DAILY, Disp: 90 capsule, Rfl: 3   hydrochlorothiazide  (HYDRODIURIL ) 25 MG tablet, TAKE 1 TABLET BY MOUTH DAILY, Disp: 30 tablet, Rfl: 11   naproxen  (NAPROSYN ) 500 MG tablet, Take 1 tablet (500 mg total) by mouth 2 (two) times daily as needed., Disp: 180 tablet, Rfl: 2   pantoprazole  (PROTONIX ) 40 MG tablet, TAKE 1 TABLET BY MOUTH DAILY, Disp: 90 tablet, Rfl: 3   potassium chloride  (KLOR-CON ) 10 MEQ tablet, Take 1 tablet (10 mEq total) by mouth daily. (Patient not taking: Reported on 04/01/2024), Disp: 90 tablet, Rfl: 3   rosuvastatin  (CRESTOR ) 40 MG tablet, Take 1 tablet (40 mg total) by  mouth daily., Disp: 90 tablet, Rfl: 2  Social History: Social History   Tobacco Use   Smoking status: Every Day    Current packs/day: 1.50    Average packs/day: 1 pack/day for 41.1 years (41.7 ttl pk-yrs)    Types: Cigarettes    Start date: 03/06/2023   Smokeless tobacco: Never   Tobacco comments:    Started smoking age 82  Vaping Use   Vaping status: Never Used  Substance Use Topics   Alcohol use: Yes     Alcohol/week: 12.0 standard drinks of alcohol    Types: 10 Cans of beer, 2 Shots of liquor per week    Comment: 1-2 drinks (beer or liquor) daily, 5th every 3 days   Drug use: Not Currently    Family Medical History: Family History  Problem Relation Age of Onset   Stroke Mother    Diabetes Mother    Heart disease Mother    Diabetes Father    Heart disease Father    Cancer Maternal Grandfather    Cancer Paternal Grandfather     Physical Examination: There were no vitals filed for this visit.  General: Patient is in no apparent distress. Attention to examination is appropriate.  Neck:   Supple.  Full range of motion.  Respiratory: Patient is breathing without any difficulty.   NEUROLOGICAL:     Awake, alert, oriented to person, place, and time.  Speech is clear and fluent.   Cranial Nerves: Pupils equal round and reactive to light.  Facial tone is symmetric.  Facial sensation is symmetric. Shoulder shrug is symmetric. Tongue protrusion is midline.    Strength: Side Biceps Triceps Deltoid Interossei Grip Wrist Ext. Wrist Flex.  R 5 5 5 5 5 5 5   L 5 5 5 5 5 5 5    Side Iliopsoas Quads Hamstring PF DF EHL  R 5 5 5 5 5 5   L 5 5 5 5 5 5    Reflexes are ***2+ and symmetric at the biceps, triceps, brachioradialis, patella and achilles.   Hoffman's is absent. Clonus is absent  Bilateral upper and lower extremity sensation is intact to light touch ***.     No evidence of dysmetria noted.  Gait is normal.    Imaging: *** I have personally reviewed the images and agree with the above interpretation.  Medical Decision Making/Assessment and Plan: Mr. Armistead is a pleasant 58 y.o. male with ***  There are no diagnoses linked to this encounter.   Thank you for involving me in the care of this patient.    Penne MICAEL Sharps MD/MSCR Neurosurgery

## 2024-04-29 ENCOUNTER — Inpatient Hospital Stay
Admission: RE | Admit: 2024-04-29 | Discharge: 2024-04-29 | Disposition: A | Payer: Self-pay | Source: Ambulatory Visit | Attending: Neurosurgery | Admitting: Neurosurgery

## 2024-04-29 ENCOUNTER — Other Ambulatory Visit: Payer: Self-pay | Admitting: Family Medicine

## 2024-04-29 DIAGNOSIS — Z049 Encounter for examination and observation for unspecified reason: Secondary | ICD-10-CM

## 2024-05-01 ENCOUNTER — Encounter: Payer: Self-pay | Admitting: Neurosurgery

## 2024-05-01 ENCOUNTER — Ambulatory Visit: Admitting: Neurosurgery

## 2024-05-01 ENCOUNTER — Ambulatory Visit

## 2024-05-01 ENCOUNTER — Telehealth: Payer: Self-pay | Admitting: Neurosurgery

## 2024-05-01 VITALS — BP 142/100 | Ht 68.0 in | Wt 167.0 lb

## 2024-05-01 DIAGNOSIS — R200 Anesthesia of skin: Secondary | ICD-10-CM

## 2024-05-01 DIAGNOSIS — R202 Paresthesia of skin: Secondary | ICD-10-CM | POA: Diagnosis not present

## 2024-05-01 DIAGNOSIS — R2689 Other abnormalities of gait and mobility: Secondary | ICD-10-CM

## 2024-05-01 DIAGNOSIS — M4712 Other spondylosis with myelopathy, cervical region: Secondary | ICD-10-CM

## 2024-05-01 DIAGNOSIS — G2581 Restless legs syndrome: Secondary | ICD-10-CM

## 2024-05-01 NOTE — Telephone Encounter (Signed)
 error

## 2024-05-01 NOTE — Progress Notes (Signed)
 Referring Physician:  Carlisle Benton CROME, FNP [address] [place],  [place] [address]  Primary Physician:  Gasper Nancyann BRAVO, MD  Discussed the use of AI scribe software for clinical note transcription with the patient, who gave verbal consent to proceed.  History of Present Illness Justin Olsen is a 58 year old male with cervical disc disease who presents with neck pain and numbness. He was referred by Dr. Kathlynn for evaluation of neck tightness and spinal cord issues.  He experiences significant neck tightness with pain radiating to the shoulder blades. Certain positions exacerbate the pain, causing tingling and numbness in the arms, especially when sitting or turning his head. The numbness can become severe, requiring him to move his arm for relief.  He reports increased pain with colder weather and difficulty using his hands, frequently dropping objects. He experiences unsteadiness when walking, particularly when rising from a seated position, and occasionally feels off balance.  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 He experiences headaches originating at the back of his head, spreading to his temples and jaw, with increased frequency in the past week. These headaches sometimes wake him at night, relieved by a hot rag and BC powder.  He smokes approximately a pack of cigarettes a day, a habit since age 85, which increased after stopping work. He wishes to return to activities like hunting and cooking, currently limited by his condition.  Conservative measures:  Physical therapy: Attempted PT at [facility] and was unable to continue due to cervical instability Multimodal medical therapy including regular antiinflammatories:  naproxen  Injections: no epidural steroid injections  Past Surgery: none  The symptoms are causing a significant impact on the patient's life.   I have utilized the care everywhere  function in epic to review the outside records available from external health systems.  Review of Systems:  A 10 point review of systems is negative, except for the pertinent positives and negatives detailed in the HPI.  Past Medical History: Past Medical History:  Diagnosis Date   Aortic atherosclerosis    CAD (coronary artery disease)    Emphysema lung (HCC)    Emphysema of lung (HCC)    Essential hypertension    GERD (gastroesophageal reflux disease)    Headache    Wears dentures    full upper and lower    Past Surgical History: Past Surgical History:  Procedure Laterality Date   CARPOMETACARPAL (CMC) FUSION OF THUMB Right 09/06/2022   Procedure: Right thumb carpometacarpal arthroplasty;  Surgeon: Kathlynn Sharper, MD;  Location: [facility];  Service: Orthopedics;  Laterality: Right;    Allergies: Allergies as of 05/01/2024 - Review Complete 05/01/2024  Allergen Reaction Noted   Lisinopril   03/11/2022    Medications:  Current Outpatient Medications:    albuterol  (VENTOLIN  HFA) 108 (90 Base) MCG/ACT inhaler, Inhale 2 puffs into the lungs every 6 (six) hours as needed for wheezing or shortness of breath., Disp: 8.5 g, Rfl: 3   amitriptyline  (ELAVIL ) 10 MG tablet, TAKE 1 TABLET BY MOUTH AT BEDTIME, MAY INCREASE TO TAKE 2 TABLETS BY MOUTH AT BEDTIME AFTER ONE WEEK, Disp: 120 tablet, Rfl: 3   ASPIRIN 81 PO, Take by mouth daily., Disp: , Rfl:    Aspirin-Salicylamide-Caffeine (BC HEADACHE POWDER PO), Take by mouth 2 (two) times daily as needed., Disp: , Rfl:    budesonide -formoterol  (SYMBICORT ) 160-4.5 MCG/ACT inhaler, Inhale 2 puffs into the lungs 2 (  two) times daily., Disp: 1 each, Rfl: 2   clonazePAM  (KLONOPIN ) 1 MG tablet, TAKE A HALF TO 1 TABLET BY MOUTH AT BEDTIME, Disp: 30 tablet, Rfl: 3   diltiazem  (CARDIZEM  CD) 300 MG 24 hr capsule, TAKE 1 CAPSULE BY MOUTH DAILY, Disp: 90 capsule, Rfl: 3   hydrochlorothiazide  (HYDRODIURIL ) 25 MG tablet, TAKE 1 TABLET BY MOUTH  DAILY, Disp: 30 tablet, Rfl: 11   naproxen  (NAPROSYN ) 500 MG tablet, Take 1 tablet (500 mg total) by mouth 2 (two) times daily as needed., Disp: 180 tablet, Rfl: 2   pantoprazole  (PROTONIX ) 40 MG tablet, TAKE 1 TABLET BY MOUTH DAILY, Disp: 90 tablet, Rfl: 3   rosuvastatin  (CRESTOR ) 40 MG tablet, Take 1 tablet (40 mg total) by mouth daily., Disp: 90 tablet, Rfl: 2  Social History: Social History   Tobacco Use   Smoking status: Every Day    Current packs/day: 1.50    Average packs/day: 1 pack/day for 41.2 years (41.7 ttl pk-yrs)    Types: Cigarettes    Start date: 03/06/2023   Smokeless tobacco: Never   Tobacco comments:    Started smoking age 36  Vaping Use   Vaping status: Never Used  Substance Use Topics   Alcohol use: Yes    Alcohol/week: 12.0 standard drinks of alcohol    Types: 10 Cans of beer, 2 Shots of liquor per week    Comment: 1-2 drinks (beer or liquor) daily, 5th every 3 days   Drug use: Not Currently    Family Medical History: Family History  Problem Relation Age of Onset   Stroke Mother    Diabetes Mother    Heart disease Mother    Diabetes Father    Heart disease Father    Cancer Maternal Grandfather    Cancer Paternal Grandfather     Physical Examination: Vitals:   05/01/24 1304  BP: (!) 142/100   Imaging: Narrative & Impression  CLINICAL DATA:  Neck and shoulder pain   EXAM: MRI CERVICAL SPINE WITHOUT CONTRAST   TECHNIQUE: Multiplanar, multisequence MR imaging of the cervical spine was performed. No intravenous contrast was administered.   COMPARISON:  None Available.   FINDINGS: Despite efforts by the technologist and patient, motion artifact is present on today's exam and could not be eliminated. This reduces exam sensitivity and specificity.   Alignment: 3 mm degenerative anterolisthesis at C4-5 and 2 mm degenerative retrolisthesis at C5-6.   Vertebrae: Type 1 degenerative endplate findings posteriorly at C3-4 and to a lesser extent  at C5-6 and C6-7. Degenerative facet edema on the right at C3-4.   Cord: Unremarkable   Posterior Fossa, vertebral arteries, paraspinal tissues: Unremarkable   Disc levels:   C2-3: No impingement.  Left facet arthropathy.   C3-4: Prominent right moderate left foraminal stenosis with moderate central stenosis due to disc bulge, uncinate spurring, and facet arthropathy.   C4-5: Moderate bilateral foraminal stenosis and moderate central stenosis due to disc uncovering, disc bulge, intervertebral spurring, and left greater than right facet arthropathy.   C5-6: Prominent right and moderate left foraminal stenosis with moderate central stenosis due to disc osteophyte complex, uncinate spurring, and facet arthropathy.   C6-7: Moderate to prominent bilateral foraminal stenosis due to uncinate and facet spurring and disc bulge.   C7-T1: Unremarkable.   IMPRESSION: 1. Cervical spondylosis and degenerative disc disease causing prominent impingement at C3-4 and C5-6; moderate to prominent impingement at C6-7; and moderate impingement at C4-5. 2. Degenerative facet edema on the right at C3-4.  Electronically Signed   By: Ryan Salvage M.D.   On: 04/02/2024 16:24   I have personally reviewed the images and agree with the above interpretation.  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 High reflexes in the right arm suggest possible spinal cord compression. Smoking increases risk of poor surgical outcomes if surgery becomes necessary.  Patient would like to avoid surgical intervention and understands risks of continued cervical compression.  I would like to follow him to continue to monitor his myelopathy. - Ordered special x-rays to assess neck stability. - Referred for cervical  spine injections to manage pain. - Scheduled follow-up in 3-4 months to monitor condition. - Advised smoking cessation to reduce surgical risk if surgery becomes necessary.  Chronic headaches Headaches occur intermittently, sometimes waking him at night. Possible sinus involvement, but exact etiology unclear.  Unlikely to be cervicogenic given the anterior positioning.  Will defer the rest of his workup to his PCP and team - Continue using hot compresses and BC powder for headache relief.  Penne MICAEL Sharps MD/MSCR Neurosurgery

## 2024-05-09 ENCOUNTER — Other Ambulatory Visit: Payer: Self-pay | Admitting: Family Medicine

## 2024-05-09 DIAGNOSIS — R0600 Dyspnea, unspecified: Secondary | ICD-10-CM

## 2024-05-14 ENCOUNTER — Other Ambulatory Visit: Payer: Self-pay | Admitting: Family Medicine

## 2024-05-15 ENCOUNTER — Ambulatory Visit: Payer: Self-pay | Admitting: Neurosurgery

## 2024-05-20 ENCOUNTER — Other Ambulatory Visit: Payer: Self-pay | Admitting: Family Medicine

## 2024-05-20 ENCOUNTER — Other Ambulatory Visit (HOSPITAL_COMMUNITY): Payer: Self-pay

## 2024-05-20 DIAGNOSIS — G2581 Restless legs syndrome: Secondary | ICD-10-CM

## 2024-05-21 ENCOUNTER — Other Ambulatory Visit (HOSPITAL_BASED_OUTPATIENT_CLINIC_OR_DEPARTMENT_OTHER): Payer: Self-pay

## 2024-08-05 ENCOUNTER — Ambulatory Visit: Admitting: Family Medicine

## 2024-08-28 ENCOUNTER — Ambulatory Visit

## 2025-04-10 ENCOUNTER — Ambulatory Visit
